# Patient Record
Sex: Male | Born: 1960
Health system: Southern US, Community
[De-identification: ages and names within clinical notes are randomized; demographics above are authoritative.]

## PROBLEM LIST (undated history)

## (undated) DIAGNOSIS — E119 Type 2 diabetes mellitus without complications: Secondary | ICD-10-CM

---

## 2004-04-03 ENCOUNTER — Encounter: Admission: RE | Admit: 2004-04-03 | Discharge: 2004-04-04 | Payer: Self-pay | Admitting: Internal Medicine

## 2011-01-23 ENCOUNTER — Ambulatory Visit
Admission: RE | Admit: 2011-01-23 | Discharge: 2011-01-23 | Disposition: A | Discharge status: Home / Self Care | Payer: BC Managed Care – PPO | Source: Ambulatory Visit | Attending: Internal Medicine | Admitting: Internal Medicine

## 2011-01-23 ENCOUNTER — Other Ambulatory Visit: Payer: Self-pay | Admitting: Internal Medicine

## 2011-01-23 DIAGNOSIS — M79604 Pain in right leg: Secondary | ICD-10-CM

## 2011-01-23 DIAGNOSIS — M7989 Other specified soft tissue disorders: Secondary | ICD-10-CM

## 2015-12-29 DIAGNOSIS — E113512 Type 2 diabetes mellitus with proliferative diabetic retinopathy with macular edema, left eye: Secondary | ICD-10-CM | POA: Diagnosis not present

## 2016-01-02 DIAGNOSIS — Z6827 Body mass index (BMI) 27.0-27.9, adult: Secondary | ICD-10-CM | POA: Diagnosis not present

## 2016-01-02 DIAGNOSIS — E1165 Type 2 diabetes mellitus with hyperglycemia: Secondary | ICD-10-CM | POA: Diagnosis not present

## 2016-01-11 DIAGNOSIS — H26492 Other secondary cataract, left eye: Secondary | ICD-10-CM | POA: Diagnosis not present

## 2016-01-31 DIAGNOSIS — E113593 Type 2 diabetes mellitus with proliferative diabetic retinopathy without macular edema, bilateral: Secondary | ICD-10-CM | POA: Diagnosis not present

## 2016-07-20 DIAGNOSIS — E113592 Type 2 diabetes mellitus with proliferative diabetic retinopathy without macular edema, left eye: Secondary | ICD-10-CM | POA: Diagnosis not present

## 2016-07-20 DIAGNOSIS — E113591 Type 2 diabetes mellitus with proliferative diabetic retinopathy without macular edema, right eye: Secondary | ICD-10-CM | POA: Diagnosis not present

## 2016-07-23 DIAGNOSIS — E113591 Type 2 diabetes mellitus with proliferative diabetic retinopathy without macular edema, right eye: Secondary | ICD-10-CM | POA: Diagnosis not present

## 2016-08-13 DIAGNOSIS — E113591 Type 2 diabetes mellitus with proliferative diabetic retinopathy without macular edema, right eye: Secondary | ICD-10-CM | POA: Diagnosis not present

## 2016-08-14 DIAGNOSIS — N529 Male erectile dysfunction, unspecified: Secondary | ICD-10-CM | POA: Diagnosis not present

## 2016-08-14 DIAGNOSIS — R5383 Other fatigue: Secondary | ICD-10-CM | POA: Diagnosis not present

## 2016-08-14 DIAGNOSIS — Z23 Encounter for immunization: Secondary | ICD-10-CM | POA: Diagnosis not present

## 2016-08-14 DIAGNOSIS — Z1211 Encounter for screening for malignant neoplasm of colon: Secondary | ICD-10-CM | POA: Diagnosis not present

## 2016-08-14 DIAGNOSIS — E1165 Type 2 diabetes mellitus with hyperglycemia: Secondary | ICD-10-CM | POA: Diagnosis not present

## 2016-08-20 DIAGNOSIS — E113591 Type 2 diabetes mellitus with proliferative diabetic retinopathy without macular edema, right eye: Secondary | ICD-10-CM | POA: Diagnosis not present

## 2016-09-17 DIAGNOSIS — E103512 Type 1 diabetes mellitus with proliferative diabetic retinopathy with macular edema, left eye: Secondary | ICD-10-CM | POA: Diagnosis not present

## 2016-09-17 DIAGNOSIS — E103511 Type 1 diabetes mellitus with proliferative diabetic retinopathy with macular edema, right eye: Secondary | ICD-10-CM | POA: Diagnosis not present

## 2016-10-15 DIAGNOSIS — E113593 Type 2 diabetes mellitus with proliferative diabetic retinopathy without macular edema, bilateral: Secondary | ICD-10-CM | POA: Diagnosis not present

## 2016-10-26 DIAGNOSIS — L03113 Cellulitis of right upper limb: Secondary | ICD-10-CM | POA: Diagnosis not present

## 2016-10-26 DIAGNOSIS — S61459A Open bite of unspecified hand, initial encounter: Secondary | ICD-10-CM | POA: Diagnosis not present

## 2016-10-26 DIAGNOSIS — Z6830 Body mass index (BMI) 30.0-30.9, adult: Secondary | ICD-10-CM | POA: Diagnosis not present

## 2016-11-12 DIAGNOSIS — E113593 Type 2 diabetes mellitus with proliferative diabetic retinopathy without macular edema, bilateral: Secondary | ICD-10-CM | POA: Diagnosis not present

## 2016-11-15 DIAGNOSIS — Z1389 Encounter for screening for other disorder: Secondary | ICD-10-CM | POA: Diagnosis not present

## 2016-11-15 DIAGNOSIS — E1165 Type 2 diabetes mellitus with hyperglycemia: Secondary | ICD-10-CM | POA: Diagnosis not present

## 2016-12-10 DIAGNOSIS — E113593 Type 2 diabetes mellitus with proliferative diabetic retinopathy without macular edema, bilateral: Secondary | ICD-10-CM | POA: Diagnosis not present

## 2017-01-09 DIAGNOSIS — E663 Overweight: Secondary | ICD-10-CM | POA: Diagnosis not present

## 2017-01-09 DIAGNOSIS — Z6829 Body mass index (BMI) 29.0-29.9, adult: Secondary | ICD-10-CM | POA: Diagnosis not present

## 2017-01-09 DIAGNOSIS — J3089 Other allergic rhinitis: Secondary | ICD-10-CM | POA: Diagnosis not present

## 2017-01-21 DIAGNOSIS — E113591 Type 2 diabetes mellitus with proliferative diabetic retinopathy without macular edema, right eye: Secondary | ICD-10-CM | POA: Diagnosis not present

## 2017-02-19 DIAGNOSIS — E113511 Type 2 diabetes mellitus with proliferative diabetic retinopathy with macular edema, right eye: Secondary | ICD-10-CM | POA: Diagnosis not present

## 2017-03-18 DIAGNOSIS — E113591 Type 2 diabetes mellitus with proliferative diabetic retinopathy without macular edema, right eye: Secondary | ICD-10-CM | POA: Diagnosis not present

## 2017-04-15 DIAGNOSIS — E113512 Type 2 diabetes mellitus with proliferative diabetic retinopathy with macular edema, left eye: Secondary | ICD-10-CM | POA: Diagnosis not present

## 2017-04-15 DIAGNOSIS — E113511 Type 2 diabetes mellitus with proliferative diabetic retinopathy with macular edema, right eye: Secondary | ICD-10-CM | POA: Diagnosis not present

## 2017-06-10 DIAGNOSIS — E113511 Type 2 diabetes mellitus with proliferative diabetic retinopathy with macular edema, right eye: Secondary | ICD-10-CM | POA: Diagnosis not present

## 2017-06-13 DIAGNOSIS — E1165 Type 2 diabetes mellitus with hyperglycemia: Secondary | ICD-10-CM | POA: Diagnosis not present

## 2017-06-13 DIAGNOSIS — E7800 Pure hypercholesterolemia, unspecified: Secondary | ICD-10-CM | POA: Diagnosis not present

## 2017-06-13 DIAGNOSIS — Z6829 Body mass index (BMI) 29.0-29.9, adult: Secondary | ICD-10-CM | POA: Diagnosis not present

## 2017-06-13 DIAGNOSIS — Z23 Encounter for immunization: Secondary | ICD-10-CM | POA: Diagnosis not present

## 2017-08-26 DIAGNOSIS — E113591 Type 2 diabetes mellitus with proliferative diabetic retinopathy without macular edema, right eye: Secondary | ICD-10-CM | POA: Diagnosis not present

## 2017-09-13 DIAGNOSIS — Z1211 Encounter for screening for malignant neoplasm of colon: Secondary | ICD-10-CM | POA: Diagnosis not present

## 2017-09-13 DIAGNOSIS — E1165 Type 2 diabetes mellitus with hyperglycemia: Secondary | ICD-10-CM | POA: Diagnosis not present

## 2017-09-13 DIAGNOSIS — E7800 Pure hypercholesterolemia, unspecified: Secondary | ICD-10-CM | POA: Diagnosis not present

## 2017-09-24 DIAGNOSIS — Z1212 Encounter for screening for malignant neoplasm of rectum: Secondary | ICD-10-CM | POA: Diagnosis not present

## 2017-09-24 DIAGNOSIS — Z1211 Encounter for screening for malignant neoplasm of colon: Secondary | ICD-10-CM | POA: Diagnosis not present

## 2017-10-09 DIAGNOSIS — J209 Acute bronchitis, unspecified: Secondary | ICD-10-CM | POA: Diagnosis not present

## 2017-10-09 DIAGNOSIS — J0100 Acute maxillary sinusitis, unspecified: Secondary | ICD-10-CM | POA: Diagnosis not present

## 2017-11-04 DIAGNOSIS — E113591 Type 2 diabetes mellitus with proliferative diabetic retinopathy without macular edema, right eye: Secondary | ICD-10-CM | POA: Diagnosis not present

## 2017-12-24 DIAGNOSIS — Z6831 Body mass index (BMI) 31.0-31.9, adult: Secondary | ICD-10-CM | POA: Diagnosis not present

## 2017-12-24 DIAGNOSIS — Z1331 Encounter for screening for depression: Secondary | ICD-10-CM | POA: Diagnosis not present

## 2017-12-24 DIAGNOSIS — G4700 Insomnia, unspecified: Secondary | ICD-10-CM | POA: Diagnosis not present

## 2017-12-24 DIAGNOSIS — E1165 Type 2 diabetes mellitus with hyperglycemia: Secondary | ICD-10-CM | POA: Diagnosis not present

## 2018-02-11 DIAGNOSIS — E113511 Type 2 diabetes mellitus with proliferative diabetic retinopathy with macular edema, right eye: Secondary | ICD-10-CM | POA: Diagnosis not present

## 2018-04-04 DIAGNOSIS — E7800 Pure hypercholesterolemia, unspecified: Secondary | ICD-10-CM | POA: Diagnosis not present

## 2018-04-04 DIAGNOSIS — E1165 Type 2 diabetes mellitus with hyperglycemia: Secondary | ICD-10-CM | POA: Diagnosis not present

## 2018-04-04 DIAGNOSIS — Z125 Encounter for screening for malignant neoplasm of prostate: Secondary | ICD-10-CM | POA: Diagnosis not present

## 2018-04-04 DIAGNOSIS — Z6831 Body mass index (BMI) 31.0-31.9, adult: Secondary | ICD-10-CM | POA: Diagnosis not present

## 2018-05-06 DIAGNOSIS — E113593 Type 2 diabetes mellitus with proliferative diabetic retinopathy without macular edema, bilateral: Secondary | ICD-10-CM | POA: Diagnosis not present

## 2018-06-27 DIAGNOSIS — H524 Presbyopia: Secondary | ICD-10-CM | POA: Diagnosis not present

## 2018-06-27 DIAGNOSIS — E113593 Type 2 diabetes mellitus with proliferative diabetic retinopathy without macular edema, bilateral: Secondary | ICD-10-CM | POA: Diagnosis not present

## 2018-07-08 DIAGNOSIS — Z23 Encounter for immunization: Secondary | ICD-10-CM | POA: Diagnosis not present

## 2018-07-08 DIAGNOSIS — E1165 Type 2 diabetes mellitus with hyperglycemia: Secondary | ICD-10-CM | POA: Diagnosis not present

## 2018-07-08 DIAGNOSIS — Z6831 Body mass index (BMI) 31.0-31.9, adult: Secondary | ICD-10-CM | POA: Diagnosis not present

## 2018-08-25 DIAGNOSIS — E113512 Type 2 diabetes mellitus with proliferative diabetic retinopathy with macular edema, left eye: Secondary | ICD-10-CM | POA: Diagnosis not present

## 2018-08-25 DIAGNOSIS — E113511 Type 2 diabetes mellitus with proliferative diabetic retinopathy with macular edema, right eye: Secondary | ICD-10-CM | POA: Diagnosis not present

## 2018-09-29 DIAGNOSIS — E113511 Type 2 diabetes mellitus with proliferative diabetic retinopathy with macular edema, right eye: Secondary | ICD-10-CM | POA: Diagnosis not present

## 2018-10-08 DIAGNOSIS — E1165 Type 2 diabetes mellitus with hyperglycemia: Secondary | ICD-10-CM | POA: Diagnosis not present

## 2018-10-08 DIAGNOSIS — Z6833 Body mass index (BMI) 33.0-33.9, adult: Secondary | ICD-10-CM | POA: Diagnosis not present

## 2018-10-08 DIAGNOSIS — N529 Male erectile dysfunction, unspecified: Secondary | ICD-10-CM | POA: Diagnosis not present

## 2018-10-08 DIAGNOSIS — E7800 Pure hypercholesterolemia, unspecified: Secondary | ICD-10-CM | POA: Diagnosis not present

## 2018-10-27 DIAGNOSIS — E113592 Type 2 diabetes mellitus with proliferative diabetic retinopathy without macular edema, left eye: Secondary | ICD-10-CM | POA: Diagnosis not present

## 2018-11-26 DIAGNOSIS — Z6833 Body mass index (BMI) 33.0-33.9, adult: Secondary | ICD-10-CM | POA: Diagnosis not present

## 2018-11-26 DIAGNOSIS — J101 Influenza due to other identified influenza virus with other respiratory manifestations: Secondary | ICD-10-CM | POA: Diagnosis not present

## 2018-12-04 DIAGNOSIS — E113593 Type 2 diabetes mellitus with proliferative diabetic retinopathy without macular edema, bilateral: Secondary | ICD-10-CM | POA: Diagnosis not present

## 2019-01-07 DIAGNOSIS — Z1331 Encounter for screening for depression: Secondary | ICD-10-CM | POA: Diagnosis not present

## 2019-01-07 DIAGNOSIS — E1165 Type 2 diabetes mellitus with hyperglycemia: Secondary | ICD-10-CM | POA: Diagnosis not present

## 2019-01-07 DIAGNOSIS — N529 Male erectile dysfunction, unspecified: Secondary | ICD-10-CM | POA: Diagnosis not present

## 2019-01-13 DIAGNOSIS — E113591 Type 2 diabetes mellitus with proliferative diabetic retinopathy without macular edema, right eye: Secondary | ICD-10-CM | POA: Diagnosis not present

## 2019-03-02 DIAGNOSIS — E113591 Type 2 diabetes mellitus with proliferative diabetic retinopathy without macular edema, right eye: Secondary | ICD-10-CM | POA: Diagnosis not present

## 2019-04-14 DIAGNOSIS — Z125 Encounter for screening for malignant neoplasm of prostate: Secondary | ICD-10-CM | POA: Diagnosis not present

## 2019-04-14 DIAGNOSIS — J309 Allergic rhinitis, unspecified: Secondary | ICD-10-CM | POA: Diagnosis not present

## 2019-04-14 DIAGNOSIS — N529 Male erectile dysfunction, unspecified: Secondary | ICD-10-CM | POA: Diagnosis not present

## 2019-04-14 DIAGNOSIS — E7800 Pure hypercholesterolemia, unspecified: Secondary | ICD-10-CM | POA: Diagnosis not present

## 2019-04-14 DIAGNOSIS — E1165 Type 2 diabetes mellitus with hyperglycemia: Secondary | ICD-10-CM | POA: Diagnosis not present

## 2019-04-27 DIAGNOSIS — E113591 Type 2 diabetes mellitus with proliferative diabetic retinopathy without macular edema, right eye: Secondary | ICD-10-CM | POA: Diagnosis not present

## 2019-06-22 DIAGNOSIS — E113591 Type 2 diabetes mellitus with proliferative diabetic retinopathy without macular edema, right eye: Secondary | ICD-10-CM | POA: Diagnosis not present

## 2019-07-15 DIAGNOSIS — J309 Allergic rhinitis, unspecified: Secondary | ICD-10-CM | POA: Diagnosis not present

## 2019-07-15 DIAGNOSIS — E7800 Pure hypercholesterolemia, unspecified: Secondary | ICD-10-CM | POA: Diagnosis not present

## 2019-07-15 DIAGNOSIS — Z23 Encounter for immunization: Secondary | ICD-10-CM | POA: Diagnosis not present

## 2019-07-15 DIAGNOSIS — E1165 Type 2 diabetes mellitus with hyperglycemia: Secondary | ICD-10-CM | POA: Diagnosis not present

## 2019-07-15 DIAGNOSIS — Z6832 Body mass index (BMI) 32.0-32.9, adult: Secondary | ICD-10-CM | POA: Diagnosis not present

## 2019-08-17 DIAGNOSIS — E113513 Type 2 diabetes mellitus with proliferative diabetic retinopathy with macular edema, bilateral: Secondary | ICD-10-CM | POA: Diagnosis not present

## 2019-10-19 DIAGNOSIS — E113511 Type 2 diabetes mellitus with proliferative diabetic retinopathy with macular edema, right eye: Secondary | ICD-10-CM | POA: Diagnosis not present

## 2019-10-21 DIAGNOSIS — G4700 Insomnia, unspecified: Secondary | ICD-10-CM | POA: Diagnosis not present

## 2019-10-21 DIAGNOSIS — E7800 Pure hypercholesterolemia, unspecified: Secondary | ICD-10-CM | POA: Diagnosis not present

## 2019-10-21 DIAGNOSIS — Z6835 Body mass index (BMI) 35.0-35.9, adult: Secondary | ICD-10-CM | POA: Diagnosis not present

## 2019-10-21 DIAGNOSIS — E1165 Type 2 diabetes mellitus with hyperglycemia: Secondary | ICD-10-CM | POA: Diagnosis not present

## 2019-12-21 DIAGNOSIS — E113511 Type 2 diabetes mellitus with proliferative diabetic retinopathy with macular edema, right eye: Secondary | ICD-10-CM | POA: Diagnosis not present

## 2020-01-01 DIAGNOSIS — Z20822 Contact with and (suspected) exposure to covid-19: Secondary | ICD-10-CM | POA: Diagnosis not present

## 2020-01-12 ENCOUNTER — Inpatient Hospital Stay (HOSPITAL_COMMUNITY)
Admission: EM | Admit: 2020-01-12 | Discharge: 2020-01-15 | DRG: 177 | Disposition: A | Discharge status: Home Health | Payer: BC Managed Care – PPO | Attending: Internal Medicine | Admitting: Internal Medicine

## 2020-01-12 ENCOUNTER — Emergency Department (HOSPITAL_COMMUNITY): Payer: BC Managed Care – PPO

## 2020-01-12 ENCOUNTER — Encounter (HOSPITAL_COMMUNITY): Payer: Self-pay | Admitting: Emergency Medicine

## 2020-01-12 ENCOUNTER — Other Ambulatory Visit: Payer: Self-pay

## 2020-01-12 DIAGNOSIS — J9601 Acute respiratory failure with hypoxia: Secondary | ICD-10-CM | POA: Diagnosis present

## 2020-01-12 DIAGNOSIS — Z7982 Long term (current) use of aspirin: Secondary | ICD-10-CM | POA: Diagnosis not present

## 2020-01-12 DIAGNOSIS — J9600 Acute respiratory failure, unspecified whether with hypoxia or hypercapnia: Secondary | ICD-10-CM | POA: Diagnosis not present

## 2020-01-12 DIAGNOSIS — I2699 Other pulmonary embolism without acute cor pulmonale: Secondary | ICD-10-CM | POA: Diagnosis not present

## 2020-01-12 DIAGNOSIS — E1165 Type 2 diabetes mellitus with hyperglycemia: Secondary | ICD-10-CM | POA: Diagnosis present

## 2020-01-12 DIAGNOSIS — E669 Obesity, unspecified: Secondary | ICD-10-CM | POA: Diagnosis not present

## 2020-01-12 DIAGNOSIS — E785 Hyperlipidemia, unspecified: Secondary | ICD-10-CM | POA: Diagnosis present

## 2020-01-12 DIAGNOSIS — Z6833 Body mass index (BMI) 33.0-33.9, adult: Secondary | ICD-10-CM

## 2020-01-12 DIAGNOSIS — Z9981 Dependence on supplemental oxygen: Secondary | ICD-10-CM

## 2020-01-12 DIAGNOSIS — U071 COVID-19: Secondary | ICD-10-CM

## 2020-01-12 DIAGNOSIS — Z794 Long term (current) use of insulin: Secondary | ICD-10-CM | POA: Diagnosis not present

## 2020-01-12 DIAGNOSIS — J1282 Pneumonia due to coronavirus disease 2019: Secondary | ICD-10-CM | POA: Diagnosis present

## 2020-01-12 DIAGNOSIS — Z87891 Personal history of nicotine dependence: Secondary | ICD-10-CM | POA: Diagnosis not present

## 2020-01-12 DIAGNOSIS — I82441 Acute embolism and thrombosis of right tibial vein: Secondary | ICD-10-CM | POA: Diagnosis not present

## 2020-01-12 DIAGNOSIS — I82462 Acute embolism and thrombosis of left calf muscular vein: Secondary | ICD-10-CM | POA: Diagnosis present

## 2020-01-12 DIAGNOSIS — R000 Tachycardia, unspecified: Secondary | ICD-10-CM | POA: Diagnosis not present

## 2020-01-12 DIAGNOSIS — R7989 Other specified abnormal findings of blood chemistry: Secondary | ICD-10-CM | POA: Diagnosis not present

## 2020-01-12 DIAGNOSIS — R0902 Hypoxemia: Secondary | ICD-10-CM | POA: Diagnosis not present

## 2020-01-12 DIAGNOSIS — T380X5A Adverse effect of glucocorticoids and synthetic analogues, initial encounter: Secondary | ICD-10-CM | POA: Diagnosis not present

## 2020-01-12 DIAGNOSIS — Z79899 Other long term (current) drug therapy: Secondary | ICD-10-CM

## 2020-01-12 DIAGNOSIS — Z833 Family history of diabetes mellitus: Secondary | ICD-10-CM | POA: Diagnosis not present

## 2020-01-12 DIAGNOSIS — R0602 Shortness of breath: Secondary | ICD-10-CM | POA: Diagnosis not present

## 2020-01-12 DIAGNOSIS — N179 Acute kidney failure, unspecified: Secondary | ICD-10-CM | POA: Diagnosis not present

## 2020-01-12 HISTORY — DX: Type 2 diabetes mellitus without complications: E11.9

## 2020-01-12 LAB — BASIC METABOLIC PANEL
Anion gap: 15 (ref 5–15)
BUN: 23 mg/dL — ABNORMAL HIGH (ref 6–20)
CO2: 25 mmol/L (ref 22–32)
Calcium: 8.8 mg/dL — ABNORMAL LOW (ref 8.9–10.3)
Chloride: 97 mmol/L — ABNORMAL LOW (ref 98–111)
Creatinine, Ser: 1.31 mg/dL — ABNORMAL HIGH (ref 0.61–1.24)
GFR calc Af Amer: >60 mL/min (ref >60)
GFR calc non Af Amer: >60 mL/min (ref >60)
Glucose, Bld: 230 mg/dL — ABNORMAL HIGH (ref 70–99)
Potassium: 4.6 mmol/L (ref 3.5–5.1)
Sodium: 137 mmol/L (ref 135–145)

## 2020-01-12 LAB — CBC
HCT: 47.7 % (ref 39.0–52.0)
Hemoglobin: 15.2 g/dL (ref 13.0–17.0)
MCH: 29 pg (ref 26.0–34.0)
MCHC: 31.9 g/dL (ref 30.0–36.0)
MCV: 90.9 fL (ref 80.0–100.0)
Platelets: 427 10*3/uL — ABNORMAL HIGH (ref 150–400)
RBC: 5.25 MIL/uL (ref 4.22–5.81)
RDW: 13.7 % (ref 11.5–15.5)
WBC: 6.8 10*3/uL (ref 4.0–10.5)
nRBC: 0 % (ref 0.0–0.2)

## 2020-01-12 LAB — LACTATE DEHYDROGENASE: LDH: 441 U/L — ABNORMAL HIGH (ref 98–192)

## 2020-01-12 LAB — C-REACTIVE PROTEIN: CRP: 11.5 mg/dL — ABNORMAL HIGH (ref <1.0)

## 2020-01-12 LAB — D-DIMER, QUANTITATIVE: D-Dimer, Quant: >20 ug/mL-FEU — ABNORMAL HIGH (ref 0.00–0.50)

## 2020-01-12 LAB — FERRITIN: Ferritin: 341 ng/mL — ABNORMAL HIGH (ref 24–336)

## 2020-01-12 LAB — FIBRINOGEN: Fibrinogen: 684 mg/dL — ABNORMAL HIGH (ref 210–475)

## 2020-01-12 LAB — PROCALCITONIN: Procalcitonin: 0.29 ng/mL

## 2020-01-12 LAB — RESPIRATORY PANEL BY RT PCR (FLU A&B, COVID)
Influenza A by PCR: NEGATIVE
Influenza B by PCR: NEGATIVE
SARS Coronavirus 2 by RT PCR: POSITIVE — AB

## 2020-01-12 LAB — LACTIC ACID, PLASMA
Lactic Acid, Venous: 2.7 mmol/L (ref 0.5–1.9)
Lactic Acid, Venous: 2.1 mmol/L (ref 0.5–1.9)

## 2020-01-12 LAB — TRIGLYCERIDES: Triglycerides: 124 mg/dL (ref <150)

## 2020-01-12 LAB — TROPONIN I (HIGH SENSITIVITY)
Troponin I (High Sensitivity): 5 ng/L (ref <18)
Troponin I (High Sensitivity): 5 ng/L (ref <18)

## 2020-01-12 MED ORDER — SODIUM CHLORIDE 0.9 % IV BOLUS
500.0000 mL | Freq: Once | INTRAVENOUS | Status: AC
  Administered 2020-01-12: 500 mL via INTRAVENOUS

## 2020-01-12 MED ORDER — DEXAMETHASONE SODIUM PHOSPHATE 10 MG/ML IJ SOLN
6.0000 mg | Freq: Once | INTRAMUSCULAR | Status: AC
  Administered 2020-01-13: 6 mg via INTRAVENOUS
  Filled 2020-01-12: qty 1

## 2020-01-12 MED ORDER — SODIUM CHLORIDE 0.9 % IV SOLN
100.0000 mg | Freq: Every day | INTRAVENOUS | Status: DC
  Administered 2020-01-14 – 2020-01-15 (×2): 100 mg via INTRAVENOUS
  Filled 2020-01-12 (×4): qty 20

## 2020-01-12 MED ORDER — SODIUM CHLORIDE 0.9% FLUSH: 3.0000 mL | Freq: Once | INTRAVENOUS | Status: DC

## 2020-01-12 MED ORDER — SODIUM CHLORIDE 0.9 % IV SOLN
200.0000 mg | Freq: Once | INTRAVENOUS | Status: AC
  Administered 2020-01-13: 200 mg via INTRAVENOUS
  Filled 2020-01-12: qty 40

## 2020-01-12 NOTE — ED Triage Notes (Signed)
Pt arrives to ED with C/o of shortness of breath and fatigue after testing + for covid April 25th. Pt had room air sats 88% placed on 2L [place].

## 2020-01-12 NOTE — ED Provider Notes (Addendum)
[name] EMERGENCY DEPARTMENT Provider Note   CSN: [id number] Arrival date & time: 01/12/20  1742     History Chief Complaint  Patient presents with  . Shortness of Breath    Cordarrel Stiefel is a 59 y.o. male.  59 year old male with prior medical history as detailed below presents for evaluation of shortness of breath.  Patient reports positive Covid test on April 25.  Patient reports increasing shortness of breath and cough over the last 1 week.  Patient denies chest pain.  Patient complains of increasing fatigue.  The history is provided by the patient and medical records.  Shortness of Breath Severity:  Moderate Onset quality:  Gradual Duration:  1 week Timing:  Constant Progression:  Worsening Chronicity:  New Relieved by:  Nothing Worsened by:  Nothing      Past Medical History:  Diagnosis Date  . Diabetes mellitus without complication (HCC)     There are no problems to display for this patient.       No family history on file.  Social History   Tobacco Use  . Smoking status: Not on file  Substance Use Topics  . Alcohol use: Not on file  . Drug use: Not on file    Home Medications Prior to Admission medications   Medication Sig Start Date End Date Taking? Authorizing Provider  acetaminophen (TYLENOL) 325 MG tablet Take 650 mg by mouth every 6 (six) hours as needed for mild pain.   Yes [provider]  aspirin EC 81 MG tablet Take 81 mg by mouth at bedtime.   Yes [provider]  atorvastatin (LIPITOR) 20 MG tablet Take 20 mg by mouth at bedtime.  01/02/20  Yes [provider]  Continuous Blood Gluc Sensor (FREESTYLE LIBRE 14 DAY SENSOR) MISC Inject 1 patch into the skin every 14 (fourteen) days. 12/13/19  Yes [provider]  JANUVIA 100 MG tablet Take 100 mg by mouth daily.  10/01/19  Yes [provider]  LEVEMIR FLEXTOUCH 100 UNIT/ML FlexPen Inject 80 Units into the skin See admin  instructions. Inject 80 units into the skin at bedtime, increasing by 1 unit a day until fasting glucose is 90-110 12/11/19  Yes [provider]  levocetirizine (XYZAL) 5 MG tablet Take 5 mg by mouth at bedtime.  10/01/19  Yes [provider]  metFORMIN (GLUCOPHAGE-XR) 500 MG 24 hr tablet Take 2,000 mg by mouth in the morning. 12/29/19  Yes [provider]  NOVOLOG FLEXPEN 100 UNIT/ML FlexPen Inject 5-25 Units into the skin See admin instructions. Inject 24-25 units into the skin before breakfast, 5 units before lunch, and 25 units before supper (evening meal) 12/07/19  Yes [provider]  VITAMIN D PO Take 1 capsule by mouth at bedtime.   Yes [provider]    Allergies    Patient has no known allergies.  Review of Systems   Review of Systems  Respiratory: Positive for shortness of breath.   All other systems reviewed and are negative.   Physical Exam Updated Vital Signs BP (!) 148/89   Pulse (!) 110   Temp 98.4 F (36.9 C) (Oral)   Resp (!) 29   Ht 5\' 10"  (1.778 m)   Wt 108.9 kg   SpO2 94%   BMI 34.44 kg/m   Physical Exam Vitals and nursing note reviewed.  Constitutional:      General: He is not in acute distress.    Appearance: He is ill-appearing.  HENT:     Head: Normocephalic and atraumatic.  Eyes:     Conjunctiva/sclera: Conjunctivae normal.     Pupils: Pupils are equal, round, and reactive to light.  Cardiovascular:     Rate and Rhythm: Normal rate and regular rhythm.     Heart sounds: Normal heart sounds.  Pulmonary:     Effort: Pulmonary effort is normal. No respiratory distress.     Breath sounds: Normal breath sounds.  Abdominal:     General: There is no distension.     Palpations: Abdomen is soft.     Tenderness: There is no abdominal tenderness.  Musculoskeletal:        General: No deformity. Normal range of motion.     Cervical back: Normal range of motion and neck supple.  Skin:    General: Skin is warm and  dry.  Neurological:     Mental Status: He is alert and oriented to person, place, and time.     ED Results / Procedures / Treatments   Labs (all labs ordered are listed, but only abnormal results are displayed) Labs Reviewed  RESPIRATORY PANEL BY RT PCR (FLU A&B, COVID) - Abnormal; Notable for the following components:      Result Value   SARS Coronavirus 2 by RT PCR POSITIVE (*)    All other components within normal limits  BASIC METABOLIC PANEL - Abnormal; Notable for the following components:   Chloride 97 (*)    Glucose, Bld 230 (*)    BUN 23 (*)    Creatinine, Ser 1.31 (*)    Calcium 8.8 (*)    All other components within normal limits  CBC - Abnormal; Notable for the following components:   Platelets 427 (*)    All other components within normal limits  LACTIC ACID, PLASMA - Abnormal; Notable for the following components:   Lactic Acid, Venous 2.7 (*)    All other components within normal limits  D-DIMER, QUANTITATIVE (NOT AT [facility]) - Abnormal; Notable for the following components:   D-Dimer, Quant >20.00 (*)    All other components within normal limits  LACTATE DEHYDROGENASE - Abnormal; Notable for the following components:   LDH 441 (*)    All other components within normal limits  FERRITIN - Abnormal; Notable for the following components:   Ferritin 341 (*)    All other components within normal limits  FIBRINOGEN - Abnormal; Notable for the following components:   Fibrinogen 684 (*)    All other components within normal limits  C-REACTIVE PROTEIN - Abnormal; Notable for the following components:   CRP 11.5 (*)    All other components within normal limits  CULTURE, BLOOD (ROUTINE X 2)  CULTURE, BLOOD (ROUTINE X 2)  PROCALCITONIN  TRIGLYCERIDES  LACTIC ACID, PLASMA  TROPONIN I (HIGH SENSITIVITY)  TROPONIN I (HIGH SENSITIVITY)    EKG EKG Interpretation  Date/Time:  Tuesday Jan 12 2020 17:49:09 EDT Ventricular Rate:  117 PR Interval:  152 QRS  Duration: 82 QT Interval:  314 QTC Calculation: 438 R Axis:   50 Text Interpretation: Sinus tachycardia Cannot rule out Anterior infarct , age undetermined Abnormal ECG Confirmed by Kristine Royal [phone]) on 01/12/2020 8:11:35 PM   Radiology DG Chest Port 1 View  Result Date: 01/12/2020 CLINICAL DATA:  Short of breath, COVID-19 positive EXAM: PORTABLE CHEST 1 VIEW COMPARISON:  None. FINDINGS: Single frontal view of the chest demonstrates an unremarkable cardiac silhouette. There is patchy bilateral airspace disease greatest at the left lung base.  No effusion or pneumothorax. No acute bony abnormalities. IMPRESSION: 1. Bilateral airspace disease greatest at the left lung base, consistent with multifocal pneumonia. This is certainly compatible with history of COVID-19. Electronically Signed   By: Randa Ngo M.D.   On: 01/12/2020 20:59    Procedures Procedures (including critical care time)  Medications Ordered in ED Medications  sodium chloride flush (NS) 0.9 % injection 3 mL (has no administration in time range)  sodium chloride 0.9 % bolus 500 mL (500 mLs Intravenous New Bag/Given 01/12/20 2220)    ED Course  I have reviewed the triage vital signs and the nursing notes.  Pertinent labs & imaging results that were available during my care of the patient were reviewed by me and considered in my medical decision making (see chart for details).    MDM Rules/Calculators/A&P                      MDM  Screen complete  Dione Mccombie was evaluated in Emergency Department on 01/12/2020 for the symptoms described in the history of present illness. He was evaluated in the context of the global COVID-19 pandemic, which necessitated consideration that the patient might be at risk for infection with the SARS-CoV-2 virus that causes COVID-19. Institutional protocols and algorithms that pertain to the evaluation of patients at risk for COVID-19 are in a state of rapid change based on information  released by regulatory bodies including the CDC and federal and state organizations. These policies and algorithms were followed during the patient's care in the ED.   Patient is presenting for evaluation of worsening shortness of breath in the setting of recent Covid diagnosis.  His presentation is consistent with likely Covid pneumonia.  Patient will benefit from admission for further treatment.  Hospitalist [facility]) service is aware case and will evaluate for admission.   Final Clinical Impression(s) / ED Diagnoses Final diagnoses:  Pneumonia due to COVID-19 virus    Rx / DC Orders ED Discharge Orders    None       Valarie Merino, MD 01/12/20 2234    Valarie Merino, MD 01/12/20 [id number]

## 2020-01-13 ENCOUNTER — Other Ambulatory Visit: Payer: Self-pay

## 2020-01-13 ENCOUNTER — Inpatient Hospital Stay (HOSPITAL_COMMUNITY): Payer: BC Managed Care – PPO

## 2020-01-13 ENCOUNTER — Telehealth: Payer: Self-pay | Admitting: Unknown Physician Specialty

## 2020-01-13 ENCOUNTER — Encounter (HOSPITAL_COMMUNITY): Payer: Self-pay | Admitting: Internal Medicine

## 2020-01-13 DIAGNOSIS — Z794 Long term (current) use of insulin: Secondary | ICD-10-CM | POA: Diagnosis not present

## 2020-01-13 DIAGNOSIS — J1282 Pneumonia due to coronavirus disease 2019: Secondary | ICD-10-CM

## 2020-01-13 DIAGNOSIS — R7989 Other specified abnormal findings of blood chemistry: Secondary | ICD-10-CM | POA: Diagnosis not present

## 2020-01-13 DIAGNOSIS — U071 COVID-19: Secondary | ICD-10-CM

## 2020-01-13 DIAGNOSIS — J9601 Acute respiratory failure with hypoxia: Secondary | ICD-10-CM

## 2020-01-13 DIAGNOSIS — Z79899 Other long term (current) drug therapy: Secondary | ICD-10-CM | POA: Diagnosis not present

## 2020-01-13 DIAGNOSIS — I2699 Other pulmonary embolism without acute cor pulmonale: Secondary | ICD-10-CM | POA: Diagnosis present

## 2020-01-13 DIAGNOSIS — R000 Tachycardia, unspecified: Secondary | ICD-10-CM | POA: Diagnosis present

## 2020-01-13 DIAGNOSIS — Z9981 Dependence on supplemental oxygen: Secondary | ICD-10-CM | POA: Diagnosis not present

## 2020-01-13 DIAGNOSIS — T380X5A Adverse effect of glucocorticoids and synthetic analogues, initial encounter: Secondary | ICD-10-CM | POA: Diagnosis not present

## 2020-01-13 DIAGNOSIS — Z87891 Personal history of nicotine dependence: Secondary | ICD-10-CM | POA: Diagnosis not present

## 2020-01-13 DIAGNOSIS — R0602 Shortness of breath: Secondary | ICD-10-CM | POA: Diagnosis not present

## 2020-01-13 DIAGNOSIS — N179 Acute kidney failure, unspecified: Secondary | ICD-10-CM | POA: Diagnosis present

## 2020-01-13 DIAGNOSIS — Z833 Family history of diabetes mellitus: Secondary | ICD-10-CM | POA: Diagnosis not present

## 2020-01-13 DIAGNOSIS — E1165 Type 2 diabetes mellitus with hyperglycemia: Secondary | ICD-10-CM | POA: Diagnosis not present

## 2020-01-13 DIAGNOSIS — E669 Obesity, unspecified: Secondary | ICD-10-CM | POA: Diagnosis present

## 2020-01-13 DIAGNOSIS — E785 Hyperlipidemia, unspecified: Secondary | ICD-10-CM | POA: Diagnosis present

## 2020-01-13 DIAGNOSIS — I82441 Acute embolism and thrombosis of right tibial vein: Secondary | ICD-10-CM | POA: Diagnosis present

## 2020-01-13 DIAGNOSIS — J9600 Acute respiratory failure, unspecified whether with hypoxia or hypercapnia: Secondary | ICD-10-CM | POA: Diagnosis present

## 2020-01-13 DIAGNOSIS — Z7982 Long term (current) use of aspirin: Secondary | ICD-10-CM | POA: Diagnosis not present

## 2020-01-13 DIAGNOSIS — I82462 Acute embolism and thrombosis of left calf muscular vein: Secondary | ICD-10-CM | POA: Diagnosis present

## 2020-01-13 DIAGNOSIS — Z6833 Body mass index (BMI) 33.0-33.9, adult: Secondary | ICD-10-CM | POA: Diagnosis not present

## 2020-01-13 LAB — CBC
HCT: 41.2 % (ref 39.0–52.0)
Hemoglobin: 13.2 g/dL (ref 13.0–17.0)
MCH: 29.2 pg (ref 26.0–34.0)
MCHC: 32 g/dL (ref 30.0–36.0)
MCV: 91.2 fL (ref 80.0–100.0)
Platelets: 362 10*3/uL (ref 150–400)
RBC: 4.52 MIL/uL (ref 4.22–5.81)
RDW: 13.7 % (ref 11.5–15.5)
WBC: 7.4 10*3/uL (ref 4.0–10.5)
nRBC: 0 % (ref 0.0–0.2)

## 2020-01-13 LAB — CBC WITH DIFFERENTIAL/PLATELET
Abs Immature Granulocytes: 0.09 10*3/uL — ABNORMAL HIGH (ref 0.00–0.07)
Basophils Absolute: 0 10*3/uL (ref 0.0–0.1)
Basophils Relative: 0 %
Eosinophils Absolute: 0 10*3/uL (ref 0.0–0.5)
Eosinophils Relative: 0 %
HCT: 41.4 % (ref 39.0–52.0)
Hemoglobin: 13.5 g/dL (ref 13.0–17.0)
Immature Granulocytes: 1 %
Lymphocytes Relative: 9 %
Lymphs Abs: 0.6 10*3/uL — ABNORMAL LOW (ref 0.7–4.0)
MCH: 29.6 pg (ref 26.0–34.0)
MCHC: 32.6 g/dL (ref 30.0–36.0)
MCV: 90.8 fL (ref 80.0–100.0)
Monocytes Absolute: 0.4 10*3/uL (ref 0.1–1.0)
Monocytes Relative: 6 %
Neutro Abs: 5.3 10*3/uL (ref 1.7–7.7)
Neutrophils Relative %: 84 %
Platelets: 362 10*3/uL (ref 150–400)
RBC: 4.56 MIL/uL (ref 4.22–5.81)
RDW: 13.7 % (ref 11.5–15.5)
WBC: 6.4 10*3/uL (ref 4.0–10.5)
nRBC: 0 % (ref 0.0–0.2)

## 2020-01-13 LAB — COMPREHENSIVE METABOLIC PANEL
ALT: 23 U/L (ref 0–44)
AST: 34 U/L (ref 15–41)
Albumin: 2.2 g/dL — ABNORMAL LOW (ref 3.5–5.0)
Alkaline Phosphatase: 51 U/L (ref 38–126)
Anion gap: 13 (ref 5–15)
BUN: 28 mg/dL — ABNORMAL HIGH (ref 6–20)
CO2: 25 mmol/L (ref 22–32)
Calcium: 8.3 mg/dL — ABNORMAL LOW (ref 8.9–10.3)
Chloride: 95 mmol/L — ABNORMAL LOW (ref 98–111)
Creatinine, Ser: 1.41 mg/dL — ABNORMAL HIGH (ref 0.61–1.24)
GFR calc Af Amer: >60 mL/min (ref >60)
GFR calc non Af Amer: 55 mL/min — ABNORMAL LOW (ref >60)
Glucose, Bld: 331 mg/dL — ABNORMAL HIGH (ref 70–99)
Potassium: 5.5 mmol/L — ABNORMAL HIGH (ref 3.5–5.1)
Sodium: 133 mmol/L — ABNORMAL LOW (ref 135–145)
Total Bilirubin: 0.9 mg/dL (ref 0.3–1.2)
Total Protein: 6.5 g/dL (ref 6.5–8.1)

## 2020-01-13 LAB — GLUCOSE, CAPILLARY
Glucose-Capillary: 291 mg/dL — ABNORMAL HIGH (ref 70–99)
Glucose-Capillary: 314 mg/dL — ABNORMAL HIGH (ref 70–99)
Glucose-Capillary: 387 mg/dL — ABNORMAL HIGH (ref 70–99)
Glucose-Capillary: 378 mg/dL — ABNORMAL HIGH (ref 70–99)
Glucose-Capillary: 411 mg/dL — ABNORMAL HIGH (ref 70–99)

## 2020-01-13 LAB — FERRITIN: Ferritin: 327 ng/mL (ref 24–336)

## 2020-01-13 LAB — CREATININE, SERUM
Creatinine, Ser: 1.39 mg/dL — ABNORMAL HIGH (ref 0.61–1.24)
GFR calc Af Amer: >60 mL/min (ref >60)
GFR calc non Af Amer: 56 mL/min — ABNORMAL LOW (ref >60)

## 2020-01-13 LAB — C-REACTIVE PROTEIN: CRP: 9.2 mg/dL — ABNORMAL HIGH (ref <1.0)

## 2020-01-13 LAB — HIV ANTIBODY (ROUTINE TESTING W REFLEX): HIV Screen 4th Generation wRfx: NONREACTIVE

## 2020-01-13 LAB — D-DIMER, QUANTITATIVE: D-Dimer, Quant: >20 ug/mL-FEU — ABNORMAL HIGH (ref 0.00–0.50)

## 2020-01-13 MED ORDER — ENOXAPARIN SODIUM 120 MG/0.8ML ~~LOC~~ SOLN
110.0000 mg | Freq: Two times a day (BID) | SUBCUTANEOUS | Status: DC
  Administered 2020-01-13 – 2020-01-14 (×3): 110 mg via SUBCUTANEOUS
  Filled 2020-01-13 (×4): qty 0.73

## 2020-01-13 MED ORDER — INSULIN ASPART 100 UNIT/ML ~~LOC~~ SOLN: 0.0000 [IU] | Freq: Every day | SUBCUTANEOUS | Status: DC

## 2020-01-13 MED ORDER — INSULIN ASPART 100 UNIT/ML ~~LOC~~ SOLN
6.0000 [IU] | Freq: Three times a day (TID) | SUBCUTANEOUS | Status: DC
  Administered 2020-01-13 – 2020-01-14 (×2): 6 [IU] via SUBCUTANEOUS

## 2020-01-13 MED ORDER — HYDROCOD POLST-CPM POLST ER 10-8 MG/5ML PO SUER
5.0000 mL | Freq: Two times a day (BID) | ORAL | Status: DC | PRN
  Administered 2020-01-14: 5 mL via ORAL
  Filled 2020-01-13: qty 5

## 2020-01-13 MED ORDER — IOHEXOL 350 MG/ML SOLN
100.0000 mL | Freq: Once | INTRAVENOUS | Status: AC | PRN
  Administered 2020-01-13: 50 mL via INTRAVENOUS

## 2020-01-13 MED ORDER — ATORVASTATIN CALCIUM 10 MG PO TABS
20.0000 mg | ORAL_TABLET | Freq: Every day | ORAL | Status: DC
  Administered 2020-01-13 – 2020-01-14 (×2): 20 mg via ORAL
  Filled 2020-01-13 (×2): qty 2

## 2020-01-13 MED ORDER — INSULIN DETEMIR 100 UNIT/ML ~~LOC~~ SOLN
80.0000 [IU] | Freq: Every day | SUBCUTANEOUS | Status: DC
  Administered 2020-01-13 – 2020-01-14 (×3): 80 [IU] via SUBCUTANEOUS
  Filled 2020-01-13 (×4): qty 0.8

## 2020-01-13 MED ORDER — ENOXAPARIN SODIUM 40 MG/0.4ML ~~LOC~~ SOLN
40.0000 mg | SUBCUTANEOUS | Status: DC
  Administered 2020-01-13: 40 mg via SUBCUTANEOUS
  Filled 2020-01-13: qty 0.4

## 2020-01-13 MED ORDER — ACETAMINOPHEN 325 MG PO TABS: 650.0000 mg | ORAL_TABLET | Freq: Four times a day (QID) | ORAL | Status: DC | PRN

## 2020-01-13 MED ORDER — INSULIN ASPART 100 UNIT/ML ~~LOC~~ SOLN
0.0000 [IU] | Freq: Three times a day (TID) | SUBCUTANEOUS | Status: DC
  Administered 2020-01-13: 20 [IU] via SUBCUTANEOUS
  Administered 2020-01-14: 15 [IU] via SUBCUTANEOUS
  Administered 2020-01-14: 20 [IU] via SUBCUTANEOUS
  Administered 2020-01-14: 7 [IU] via SUBCUTANEOUS
  Administered 2020-01-15: 4 [IU] via SUBCUTANEOUS

## 2020-01-13 MED ORDER — INSULIN ASPART 100 UNIT/ML ~~LOC~~ SOLN
7.0000 [IU] | Freq: Once | SUBCUTANEOUS | Status: AC
  Administered 2020-01-13: 7 [IU] via SUBCUTANEOUS

## 2020-01-13 MED ORDER — ALBUTEROL SULFATE HFA 108 (90 BASE) MCG/ACT IN AERS
2.0000 | INHALATION_SPRAY | RESPIRATORY_TRACT | Status: DC | PRN
  Filled 2020-01-13: qty 6.7

## 2020-01-13 MED ORDER — BENZONATATE 100 MG PO CAPS
200.0000 mg | ORAL_CAPSULE | Freq: Three times a day (TID) | ORAL | Status: DC
  Administered 2020-01-13 – 2020-01-15 (×7): 200 mg via ORAL
  Filled 2020-01-13 (×7): qty 2

## 2020-01-13 MED ORDER — INSULIN ASPART 100 UNIT/ML ~~LOC~~ SOLN
0.0000 [IU] | Freq: Three times a day (TID) | SUBCUTANEOUS | Status: DC
  Administered 2020-01-13: 7 [IU] via SUBCUTANEOUS
  Administered 2020-01-13: 9 [IU] via SUBCUTANEOUS

## 2020-01-13 MED ORDER — ASPIRIN EC 81 MG PO TBEC
81.0000 mg | DELAYED_RELEASE_TABLET | Freq: Every day | ORAL | Status: DC
  Administered 2020-01-13 – 2020-01-14 (×2): 81 mg via ORAL
  Filled 2020-01-13 (×2): qty 1

## 2020-01-13 MED ORDER — DEXAMETHASONE SODIUM PHOSPHATE 10 MG/ML IJ SOLN
6.0000 mg | INTRAMUSCULAR | Status: DC
  Administered 2020-01-13 – 2020-01-15 (×3): 6 mg via INTRAVENOUS
  Filled 2020-01-13 (×3): qty 1

## 2020-01-13 MED ORDER — ONDANSETRON HCL 4 MG PO TABS: 4.0000 mg | ORAL_TABLET | Freq: Four times a day (QID) | ORAL | Status: DC | PRN

## 2020-01-13 MED ORDER — ONDANSETRON HCL 4 MG/2ML IJ SOLN: 4.0000 mg | Freq: Four times a day (QID) | INTRAMUSCULAR | Status: DC | PRN

## 2020-01-13 NOTE — H&P (Signed)
History and Physical    Mark Boyer BSW:[id number] DOB: 1960-09-12 DOA: 01/12/2020  PCP: [facility]  Patient coming from: Home.  Chief Complaint: Shortness of breath.  HPI: Mark Boyer is a 59 y.o. male with history of diabetes mellitus type 2 hyperlipidemia was tested positive for Covid on April 23 at that time patient symptoms was fatigue weakness and cough.  Since then patient has become progressively short of breath and decided to come to the ER.  Denies any chest pain diarrhea.  ED Course: In the ER patient has required 2 L oxygen to maintain sats chest x-ray showing bilateral pneumonia.  Covid test is positive.  Labs show creatinine of 1.3 no old labs to compare blood glucose was 230 CRP was 11.5 lactic acid 2.7 procalcitonin 0.2 CBC unremarkable.  D-dimer is more than 20.  EKG shows sinus tachycardia.  Patient admitted for acute hypoxic respiratory failure secondary to Covid infection.  Was started on remdesivir and Decadron.  Review of Systems: As per HPI, rest all negative.   Past Medical History:  Diagnosis Date  . Diabetes mellitus without complication (HCC)     History reviewed. No pertinent surgical history.   reports that he has quit smoking. He has never used smokeless tobacco. No history on file for alcohol and drug.  No Known Allergies  Family History  Problem Relation Age of Onset  . Diabetes Mellitus II Father     Prior to Admission medications   Medication Sig Start Date End Date Taking? Authorizing Provider  acetaminophen (TYLENOL) 325 MG tablet Take 650 mg by mouth every 6 (six) hours as needed for mild pain.   Yes [provider]  aspirin EC 81 MG tablet Take 81 mg by mouth at bedtime.   Yes [provider]  atorvastatin (LIPITOR) 20 MG tablet Take 20 mg by mouth at bedtime.  01/02/20  Yes [provider]  Continuous Blood Gluc Sensor (FREESTYLE LIBRE 14 DAY SENSOR) MISC Inject 1 patch into the skin every 14  (fourteen) days. 12/13/19  Yes [provider]  JANUVIA 100 MG tablet Take 100 mg by mouth daily.  10/01/19  Yes [provider]  LEVEMIR FLEXTOUCH 100 UNIT/ML FlexPen Inject 80 Units into the skin See admin instructions. Inject 80 units into the skin at bedtime, increasing by 1 unit a day until fasting glucose is 90-110 12/11/19  Yes [provider]  levocetirizine (XYZAL) 5 MG tablet Take 5 mg by mouth at bedtime.  10/01/19  Yes [provider]  metFORMIN (GLUCOPHAGE-XR) 500 MG 24 hr tablet Take 2,000 mg by mouth in the morning. 12/29/19  Yes [provider]  NOVOLOG FLEXPEN 100 UNIT/ML FlexPen Inject 5-25 Units into the skin See admin instructions. Inject 24-25 units into the skin before breakfast, 5 units before lunch, and 25 units before supper (evening meal) 12/07/19  Yes [provider]  VITAMIN D PO Take 1 capsule by mouth at bedtime.   Yes [provider]    Physical Exam: Constitutional: Moderately built and nourished. Vitals:   01/12/20 2215 01/12/20 2339 01/13/20 0100 01/13/20 0151  BP: (!) 148/89 140/82 139/86 135/87  Pulse: (!) 110 (!) 103  100  Resp: (!) 29 20  (!) 28  Temp:    98.2 F (36.8 C)  TempSrc:    Oral  SpO2: 94% 95%  92%  Weight:      Height:       Eyes: Anicteric no pallor. ENMT: No discharge  from the ears eyes nose or mouth. Neck: No mass felt.  No neck rigidity. Respiratory: No rhonchi or crepitations. Cardiovascular: S1-S2 heard. Abdomen: Soft nontender bowel sounds present. Musculoskeletal: No edema. Skin: No rash. Neurologic: Alert awake oriented time place and person.  Moves all extremities. Psychiatric: Appears normal per normal affect.   Labs on Admission: I have personally reviewed following labs and imaging studies  CBC: Recent Labs  Lab 01/12/20 1800  WBC 6.8  HGB 15.2  HCT 47.7  MCV 90.9  PLT 427*   Basic Metabolic Panel: Recent Labs  Lab 01/12/20 1800  NA 137  K 4.6  CL  97*  CO2 25  GLUCOSE 230*  BUN 23*  CREATININE 1.31*  CALCIUM 8.8*   GFR: Estimated Creatinine Clearance: 76.9 mL/min (A) (by C-G formula based on SCr of 1.31 mg/dL (H)). Liver Function Tests: No results for input(s): AST, ALT, ALKPHOS, BILITOT, PROT, ALBUMIN in the last 168 hours. No results for input(s): LIPASE, AMYLASE in the last 168 hours. No results for input(s): AMMONIA in the last 168 hours. Coagulation Profile: No results for input(s): INR, PROTIME in the last 168 hours. Cardiac Enzymes: No results for input(s): CKTOTAL, CKMB, CKMBINDEX, TROPONINI in the last 168 hours. BNP (last 3 results) No results for input(s): PROBNP in the last 8760 hours. HbA1C: No results for input(s): HGBA1C in the last 72 hours. CBG: No results for input(s): GLUCAP in the last 168 hours. Lipid Profile: Recent Labs    01/12/20 2010  TRIG 124   Thyroid Function Tests: No results for input(s): TSH, T4TOTAL, FREET4, T3FREE, THYROIDAB in the last 72 hours. Anemia Panel: Recent Labs    01/12/20 2010  FERRITIN 341*   Urine analysis: No results found for: COLORURINE, APPEARANCEUR, LABSPEC, PHURINE, GLUCOSEU, HGBUR, BILIRUBINUR, KETONESUR, PROTEINUR, UROBILINOGEN, NITRITE, LEUKOCYTESUR Sepsis Labs: @LABRCNTIP (procalcitonin:4,lacticidven:4) ) Recent Results (from the past 240 hour(s))  Respiratory Panel by RT PCR (Flu A&B, Covid) - Nasopharyngeal Swab     Status: Abnormal   Collection Time: 01/12/20  8:11 PM   Specimen: Nasopharyngeal Swab  Result Value Ref Range Status   SARS Coronavirus 2 by RT PCR POSITIVE (A) NEGATIVE Final    Comment: RESULT CALLED TO, READ BACK BY AND VERIFIED WITH: P PEICKER 01/12/20 2214 JDW (NOTE) SARS-CoV-2 target nucleic acids are DETECTED. SARS-CoV-2 RNA is generally detectable in upper respiratory specimens  during the acute phase of infection. Positive results are indicative of the presence of the identified virus, but do not rule out bacterial infection or  co-infection with other pathogens not detected by the test. Clinical correlation with patient history and other diagnostic information is necessary to determine patient infection status. The expected result is Negative. Fact Sheet for Patients:  2215 Fact Sheet for Healthcare Providers: https://www.moore.com/ This test is not yet approved or cleared by the https://www.young.biz/ FDA and  has been authorized for detection and/or diagnosis of SARS-CoV-2 by FDA under an Emergency Use Authorization (EUA).  This EUA will remain in effect (meaning this test can be used) for the dur ation of  the COVID-19 declaration under Section 564(b)(1) of the Act, 21 U.S.C. section 360bbb-3(b)(1), unless the authorization is terminated or revoked sooner.    Influenza A by PCR NEGATIVE NEGATIVE Final   Influenza B by PCR NEGATIVE NEGATIVE Final    Comment: (NOTE) The Xpert Xpress SARS-CoV-2/FLU/RSV assay is intended as an aid in  the diagnosis of influenza from Nasopharyngeal swab specimens and  should not be used as a sole basis  for treatment. Nasal washings and  aspirates are unacceptable for Xpert Xpress SARS-CoV-2/FLU/RSV  testing. Fact Sheet for Patients: PinkCheek.be Fact Sheet for Healthcare Providers: GravelBags.it This test is not yet approved or cleared by the [place] FDA and  has been authorized for detection and/or diagnosis of SARS-CoV-2 by  FDA under an Emergency Use Authorization (EUA). This EUA will remain  in effect (meaning this test can be used) for the duration of the  Covid-19 declaration under Section 564(b)(1) of the Act, 21  U.S.C. section 360bbb-3(b)(1), unless the authorization is  terminated or revoked. Performed at [facility], [place] [address] [place], [place] [address]      Radiological Exams on Admission: DG Chest Port 1 View  Result Date:  01/12/2020 CLINICAL DATA:  Short of breath, COVID-19 positive EXAM: PORTABLE CHEST 1 VIEW COMPARISON:  None. FINDINGS: Single frontal view of the chest demonstrates an unremarkable cardiac silhouette. There is patchy bilateral airspace disease greatest at the left lung base. No effusion or pneumothorax. No acute bony abnormalities. IMPRESSION: 1. Bilateral airspace disease greatest at the left lung base, consistent with multifocal pneumonia. This is certainly compatible with history of COVID-19. Electronically Signed   By: Randa Ngo M.D.   On: 01/12/2020 20:59    EKG: Independently reviewed.  Sinus tachycardia.  Assessment/Plan Principal Problem:   Acute respiratory failure due to COVID-19 [facility]) Active Problems:   Acute respiratory failure with hypoxia (HCC)   Pneumonia due to COVID-19 virus   Uncontrolled type 2 diabetes mellitus with hyperglycemia ([place])    1. Acute respiratory failure hypoxia secondary to COVID-19 infection patient is requiring 2 L oxygen to maintain sats patient started on IV remdesivir Decadron.  Since patient is significantly elevated D-dimer will get a CT angiogram of the chest to rule out pulmonary embolism. 2. Diabetes mellitus type 2 uncontrolled presently on Levemir 80 units at bedtime which I have ordered along with sliding scale coverage.  Note that patient is on Decadron. 3. Hyperlipidemia on statins. 4. Acute renal failure no old labs to compare follow metabolic panel.  Given that patient has acute respiratory failure with Covid pneumonia will need close monitoring for any further deterioration in inpatient status.   DVT prophylaxis: Lovenox. Code Status: Full code. Family Communication: Discussed with patient. Disposition Plan: Home. Consults called: None. Admission status: Inpatient.   Rise Patience MD [facility] Pager [phone].  If 7PM-7AM, please contact night-coverage [email or web address] Password [email or web address]  01/13/2020, 2:06 AM

## 2020-01-13 NOTE — Telephone Encounter (Signed)
Erroneous encounter

## 2020-01-13 NOTE — Progress Notes (Signed)
ANTICOAGULATION CONSULT NOTE - Initial Consult  Pharmacy Consult for Lovenox Indication: pulmonary embolus and DVT  No Known Allergies  Patient Measurements: Height: 5\' 10"  (177.8 cm) Weight: 106.5 kg (234 lb 11.2 oz) IBW/kg (Calculated) : 73 Lovenox Dosing Weight: 106.5 kg  Vital Signs: Temp: 98 F (36.7 C) (05/05 1627) Temp Source: Oral (05/05 1627) BP: 133/82 (05/05 1627) Pulse Rate: 91 (05/05 1627)  Labs: Recent Labs    01/12/20 1800 01/12/20 1800 01/12/20 2038 01/13/20 0543 01/13/20 0644  HGB 15.2   < >  --  13.2 13.5  HCT 47.7  --   --  41.2 41.4  PLT 427*  --   --  362 362  CREATININE 1.31*  --   --  1.39* 1.41*  TROPONINIHS 5  --  5  --   --    < > = values in this interval not displayed.    Estimated Creatinine Clearance: 70.6 mL/min (A) (by C-G formula based on SCr of 1.41 mg/dL (H)).   Medical History: Past Medical History:  Diagnosis Date  . Diabetes mellitus without complication [facility])    Assessment:  59 yr old male admitted early am with shortness of breath, COVID-19 pneumonia. Remdesivir and steroids begun.  D-dimer > 20, CTA showed PE and duplex showed bilateral DVT.    Lovenox 40 mg SQ given ~5:30am today for VTE prophylaxis.  Goal of Therapy:  Anti-Xa level 0.6-1 units/ml 4hrs after LMWH dose given Monitor platelets by anticoagulation protocol: Yes   Plan:   Increase Lovenox to 110 mg (~1 mg/kg) SQ q12hrs,  First full-dose given at 2pm.  Plan next Lovenox dose at midnight, then 10a/10p beginning 5/6.  Follow up for oral anticoagulation plans.    60, RPh Phone: [phone] 01/13/2020,5:13 PM

## 2020-01-13 NOTE — Progress Notes (Addendum)
PROGRESS NOTE                                                                                                                                                                                                             Patient Demographics:    Mark Boyer, is a 59 y.o. male, DOB - November 26, 1960, DZH:[id number]  Outpatient Primary MD for the patient is Practice, [facility]   Admit date - 01/12/2020   LOS - 0  Chief Complaint  Patient presents with  . Shortness of Breath       Brief Narrative: Patient is a 59 y.o. male with PMHx of insulin-dependent DM-2, HLD-who was positive for COVID-19 on 4/23-presented with shortness of breath-found to have acute hypoxic respiratory failure and subsequently admitted to the hospitalist service.  See below for further details.  Significant Events: 5/4>> admit to [facility] for hypoxemia secondary to COVID-19 pneumonia  COVID-19 medications: Steroids: 5/4>> Remdesivir: 5/4>>  Antibiotics: None  Microbiology data: 5/4: Blood cultures>> negative so far  DVT prophylaxis: SQ Lovenox  Procedures: None  Consults: None    Subjective:    Mark Boyer today remains stable on just 2 L of oxygen-continues to have cough but does not have shortness of breath at rest.   Assessment  & Plan :   Acute Hypoxic Resp Failure due to Covid 19 Viral pneumonia: Remains stable-essentially the same compared to yesterday-CRP trending down.  Plans are to continue with steroids/remdesivir, continue to titrate down FiO2 as tolerated.  Prone/Incentive Spirometry: encouraged  incentive spirometry use 3-4/hour.  Fever: afebrile  O2 requirements:  SpO2: 95 % O2 Flow Rate (L/min): 2 L/min   COVID-19 Labs: Recent Labs    01/12/20 2010 01/13/20 0644  DDIMER >20.00* >20.00*  FERRITIN 341* 327  LDH 441*  --   CRP 11.5* 9.2*    No results found for: BNP  Recent Labs  Lab 01/12/20 2010    PROCALCITON 0.29    Lab Results  Component Value Date   SARSCOV2NAA POSITIVE (A) 01/12/2020    Significantly elevated D-dimer: Has mild hypoxemia-but given Covid-at risk for VTE-awaiting CT angiogram of the chest-ordered by admitting MD.  Have also ordered a lower extremity Doppler.  For now continue with prophylactic Lovenox given mild hypoxemia.  Addendum: CTA chest and lower extremity Dopplers positive for VTE-have started therapeutic Lovenox.  AKI vs CKD stage 3a: no prior labs to compare with-follow for now  HLD: Continue statin  Insulin-dependent DM-2 with uncontrolled hyperglycemia secondary to steroids: Continue Lantus 80 units-add NovoLog 6 units with meals-change SSI to resistant scale.  Observe 24 hours-if CBGs remain uncontrolled-we will make further adjustments.  Recent Labs    01/13/20 0259 01/13/20 0750  GLUCAP 291* 314*    Obesity: Estimated body mass index is 33.68 kg/m as calculated from the following:   Height as of this encounter: 5\' 10"  (1.778 m).   Weight as of this encounter: 106.5 kg.     ABG: No results found for: PHART, PCO2ART, PO2ART, HCO3, TCO2, ACIDBASEDEF, O2SAT  Vent Settings: N/A    Condition - Stable  Family Communication  :  Patient will update spouse himself-have asked him to let me know if his family wants to talk to a MD  Code Status :  Full Code  Diet :  Diet Order            Diet heart healthy/carb modified Room service appropriate? Yes; Fluid consistency: Thin  Diet effective now               Disposition Plan  :   Status is: Inpatient  Remains inpatient appropriate because:Inpatient level of care appropriate due to severity of illness   Dispo: The patient is from: Home              Anticipated d/c is to: Home              Anticipated d/c date is: 2 days              Patient currently is not medically stable to d/c.  Barriers to discharge: Hypoxia requiring O2 supplementation/complete 5 days of IV  Remdesivir  Antimicorbials  :    Anti-infectives (From admission, onward)   Start     Dose/Rate Route Frequency Ordered Stop   01/13/20 1000  remdesivir 100 mg in sodium chloride 0.9 % 100 mL IVPB     100 mg 200 mL/hr over 30 Minutes Intravenous Daily 01/12/20 2332 01/17/20 0959   01/13/20 0000  remdesivir 200 mg in sodium chloride 0.9% 250 mL IVPB     200 mg 580 mL/hr over 30 Minutes Intravenous Once 01/12/20 2332 01/13/20 0113      Inpatient Medications  Scheduled Meds: . aspirin EC  81 mg Oral QHS  . atorvastatin  20 mg Oral QHS  . benzonatate  200 mg Oral TID  . dexamethasone (DECADRON) injection  6 mg Intravenous Q24H  . enoxaparin (LOVENOX) injection  40 mg Subcutaneous Q24H  . insulin aspart  0-9 Units Subcutaneous TID WC  . insulin detemir  80 Units Subcutaneous Q2200  . sodium chloride flush  3 mL Intravenous Once   Continuous Infusions: . remdesivir 100 mg in NS 100 mL     PRN Meds:.acetaminophen, albuterol, chlorpheniramine-HYDROcodone, ondansetron **OR** ondansetron (ZOFRAN) IV   Time Spent in minutes  25  See all Orders from today for further details   03/14/20 M.D on 01/13/2020 at 11:36 AM  To page go to [email or web address] - use universal password  [facility] -  Office  [phone]    Objective:   Vitals:   01/13/20 0151 01/13/20 0215 01/13/20 0534 01/13/20 0750  BP: 135/87  137/68 (!) 144/86  Pulse: 100 95 89 88  Resp: (!) 28 20 16  (!) 24  Temp: 98.2 F (36.8 C)  97.7 F (36.5 C) (!) 97.5  F (36.4 C)  TempSrc: Oral  Oral Oral  SpO2: 92% 94% 95% 95%  Weight:  106.5 kg    Height:  5\' 10"  (1.778 m)      Wt Readings from Last 3 Encounters:  01/13/20 106.5 kg     Intake/Output Summary (Last 24 hours) at 01/13/2020 1136 Last data filed at 01/13/2020 0900 Gross per 24 hour  Intake 680 ml  Output --  Net 680 ml     Physical Exam Gen Exam:Alert awake-not in any distress HEENT:atraumatic, normocephalic Chest: B/L clear to  auscultation anteriorly CVS:S1S2 regular Abdomen:soft non tender, non distended Extremities:no edema Neurology: Non focal Skin: no rash   Data Review:    CBC Recent Labs  Lab 01/12/20 1800 01/13/20 0543 01/13/20 0644  WBC 6.8 7.4 6.4  HGB 15.2 13.2 13.5  HCT 47.7 41.2 41.4  PLT 427* 362 362  MCV 90.9 91.2 90.8  MCH 29.0 29.2 29.6  MCHC 31.9 32.0 32.6  RDW 13.7 13.7 13.7  LYMPHSABS  --   --  0.6*  MONOABS  --   --  0.4  EOSABS  --   --  0.0  BASOSABS  --   --  0.0    Chemistries  Recent Labs  Lab 01/12/20 1800 01/13/20 0543 01/13/20 0644  NA 137  --  133*  K 4.6  --  5.5*  CL 97*  --  95*  CO2 25  --  25  GLUCOSE 230*  --  331*  BUN 23*  --  28*  CREATININE 1.31* 1.39* 1.41*  CALCIUM 8.8*  --  8.3*  AST  --   --  34  ALT  --   --  23  ALKPHOS  --   --  51  BILITOT  --   --  0.9   ------------------------------------------------------------------------------------------------------------------ Recent Labs    01/12/20 2010  TRIG 124    No results found for: HGBA1C ------------------------------------------------------------------------------------------------------------------ No results for input(s): TSH, T4TOTAL, T3FREE, THYROIDAB in the last 72 hours.  Invalid input(s): FREET3 ------------------------------------------------------------------------------------------------------------------ Recent Labs    01/12/20 2010 01/13/20 0644  FERRITIN 341* 327    Coagulation profile No results for input(s): INR, PROTIME in the last 168 hours.  Recent Labs    01/12/20 2010 01/13/20 0644  DDIMER >20.00* >20.00*    Cardiac Enzymes No results for input(s): CKMB, TROPONINI, MYOGLOBIN in the last 168 hours.  Invalid input(s): CK ------------------------------------------------------------------------------------------------------------------ No results found for: BNP  Micro Results Recent Results (from the past 240 hour(s))  Respiratory Panel by  RT PCR (Flu A&B, Covid) - Nasopharyngeal Swab     Status: Abnormal   Collection Time: 01/12/20  8:11 PM   Specimen: Nasopharyngeal Swab  Result Value Ref Range Status   SARS Coronavirus 2 by RT PCR POSITIVE (A) NEGATIVE Final    Comment: RESULT CALLED TO, READ BACK BY AND VERIFIED WITH: P PEICKER 01/12/20 2214 JDW (NOTE) SARS-CoV-2 target nucleic acids are DETECTED. SARS-CoV-2 RNA is generally detectable in upper respiratory specimens  during the acute phase of infection. Positive results are indicative of the presence of the identified virus, but do not rule out bacterial infection or co-infection with other pathogens not detected by the test. Clinical correlation with patient history and other diagnostic information is necessary to determine patient infection status. The expected result is Negative. Fact Sheet for Patients:  2215 Fact Sheet for Healthcare Providers: https://www.moore.com/ This test is not yet approved or cleared by the https://www.young.biz/ FDA and  has  been authorized for detection and/or diagnosis of SARS-CoV-2 by FDA under an Emergency Use Authorization (EUA).  This EUA will remain in effect (meaning this test can be used) for the dur ation of  the COVID-19 declaration under Section 564(b)(1) of the Act, 21 U.S.C. section 360bbb-3(b)(1), unless the authorization is terminated or revoked sooner.    Influenza A by PCR NEGATIVE NEGATIVE Final   Influenza B by PCR NEGATIVE NEGATIVE Final    Comment: (NOTE) The Xpert Xpress SARS-CoV-2/FLU/RSV assay is intended as an aid in  the diagnosis of influenza from Nasopharyngeal swab specimens and  should not be used as a sole basis for treatment. Nasal washings and  aspirates are unacceptable for Xpert Xpress SARS-CoV-2/FLU/RSV  testing. Fact Sheet for Patients: PinkCheek.be Fact Sheet for Healthcare  Providers: GravelBags.it This test is not yet approved or cleared by the [place] FDA and  has been authorized for detection and/or diagnosis of SARS-CoV-2 by  FDA under an Emergency Use Authorization (EUA). This EUA will remain  in effect (meaning this test can be used) for the duration of the  Covid-19 declaration under Section 564(b)(1) of the Act, 21  U.S.C. section 360bbb-3(b)(1), unless the authorization is  terminated or revoked. Performed at [facility], [place] [address], [place], [place] [address]   Blood Culture (routine x 2)     Status: None (Preliminary result)   Collection Time: 01/12/20  8:35 PM   Specimen: BLOOD  Result Value Ref Range Status   Specimen Description BLOOD LEFT ANTECUBITAL  Final   Special Requests   Final    BOTTLES DRAWN AEROBIC AND ANAEROBIC Blood Culture results may not be optimal due to an inadequate volume of blood received in culture bottles   Culture   Final    NO GROWTH < 12 HOURS Performed at [facility], [place] [address], [place], [place] [address]    Report Status PENDING  Incomplete  Blood Culture (routine x 2)     Status: None (Preliminary result)   Collection Time: 01/12/20  8:35 PM   Specimen: BLOOD  Result Value Ref Range Status   Specimen Description BLOOD RIGHT ARM  Final   Special Requests   Final    BOTTLES DRAWN AEROBIC AND ANAEROBIC Blood Culture adequate volume   Culture   Final    NO GROWTH < 12 HOURS Performed at [facility], [place] [address], [place], [place] [address]    Report Status PENDING  Incomplete    Radiology Reports DG Chest Port 1 View  Result Date: 01/12/2020 CLINICAL DATA:  Short of breath, COVID-19 positive EXAM: PORTABLE CHEST 1 VIEW COMPARISON:  None. FINDINGS: Single frontal view of the chest demonstrates an unremarkable cardiac silhouette. There is patchy bilateral airspace disease greatest at the left lung base. No effusion or pneumothorax. No acute bony  abnormalities. IMPRESSION: 1. Bilateral airspace disease greatest at the left lung base, consistent with multifocal pneumonia. This is certainly compatible with history of COVID-19. Electronically Signed   By: Randa Ngo M.D.   On: 01/12/2020 20:59

## 2020-01-13 NOTE — Progress Notes (Signed)
Inpatient Diabetes Program Recommendations  AACE/ADA: New Consensus Statement on Inpatient Glycemic Control (2015)  Target Ranges:  Prepandial:   less than 140 mg/dL      Peak postprandial:   less than 180 mg/dL (1-2 hours)      Critically ill patients:  140 - 180 mg/dL   Lab Results  Component Value Date   GLUCAP 314 (H) 01/13/2020    Review of Glycemic Control  Diabetes history: type 2 Outpatient Diabetes medications: Levemir 80 units at HS, Novolog 25 units at breakfast, 5 units at lunch, 25 units at supper, Januvia 100 mg daily Current orders for Inpatient glycemic control: Levemir 80 units at HS, Novolog SENSITIVE correction scale TID  Inpatient Diabetes Program Recommendations:   Noted that patient's blood sugars have been greater than 200 mg/dl. On steroids.  Recommend increasing Novolog to RESISTANT correction scale TID and add HS scale if blood sugars continue to be elevated.  Will continue to monitor blood sugars while in the hospital.  Smith Mince RN BSN CDE Diabetes Coordinator Pager: [phone]  8am-5pm

## 2020-01-13 NOTE — Progress Notes (Signed)
Lower venous duplex       has been completed. Preliminary results can be found under CV proc through chart review. Bernie Fobes, BS, RDMS, RVT   

## 2020-01-14 ENCOUNTER — Inpatient Hospital Stay (HOSPITAL_COMMUNITY): Payer: BC Managed Care – PPO

## 2020-01-14 DIAGNOSIS — J9600 Acute respiratory failure, unspecified whether with hypoxia or hypercapnia: Secondary | ICD-10-CM | POA: Diagnosis not present

## 2020-01-14 DIAGNOSIS — J9601 Acute respiratory failure with hypoxia: Secondary | ICD-10-CM | POA: Diagnosis not present

## 2020-01-14 DIAGNOSIS — U071 COVID-19: Principal | ICD-10-CM

## 2020-01-14 DIAGNOSIS — E1165 Type 2 diabetes mellitus with hyperglycemia: Secondary | ICD-10-CM | POA: Diagnosis not present

## 2020-01-14 DIAGNOSIS — J1282 Pneumonia due to coronavirus disease 2019: Secondary | ICD-10-CM | POA: Diagnosis not present

## 2020-01-14 DIAGNOSIS — R0902 Hypoxemia: Secondary | ICD-10-CM | POA: Diagnosis not present

## 2020-01-14 DIAGNOSIS — I2699 Other pulmonary embolism without acute cor pulmonale: Secondary | ICD-10-CM | POA: Diagnosis not present

## 2020-01-14 LAB — COMPREHENSIVE METABOLIC PANEL
ALT: 20 U/L (ref 0–44)
AST: 29 U/L (ref 15–41)
Albumin: 2.2 g/dL — ABNORMAL LOW (ref 3.5–5.0)
Alkaline Phosphatase: 48 U/L (ref 38–126)
Anion gap: 13 (ref 5–15)
BUN: 29 mg/dL — ABNORMAL HIGH (ref 6–20)
CO2: 22 mmol/L (ref 22–32)
Calcium: 8.5 mg/dL — ABNORMAL LOW (ref 8.9–10.3)
Chloride: 99 mmol/L (ref 98–111)
Creatinine, Ser: 1.15 mg/dL (ref 0.61–1.24)
GFR calc Af Amer: >60 mL/min (ref >60)
GFR calc non Af Amer: >60 mL/min (ref >60)
Glucose, Bld: 281 mg/dL — ABNORMAL HIGH (ref 70–99)
Potassium: 4.7 mmol/L (ref 3.5–5.1)
Sodium: 134 mmol/L — ABNORMAL LOW (ref 135–145)
Total Bilirubin: 0.7 mg/dL (ref 0.3–1.2)
Total Protein: 6.4 g/dL — ABNORMAL LOW (ref 6.5–8.1)

## 2020-01-14 LAB — CBC
HCT: 41.3 % (ref 39.0–52.0)
Hemoglobin: 13.3 g/dL (ref 13.0–17.0)
MCH: 28.7 pg (ref 26.0–34.0)
MCHC: 32.2 g/dL (ref 30.0–36.0)
MCV: 89 fL (ref 80.0–100.0)
Platelets: 383 10*3/uL (ref 150–400)
RBC: 4.64 MIL/uL (ref 4.22–5.81)
RDW: 13.4 % (ref 11.5–15.5)
WBC: 9.1 10*3/uL (ref 4.0–10.5)
nRBC: 0 % (ref 0.0–0.2)

## 2020-01-14 LAB — HEMOGLOBIN A1C
Hgb A1c MFr Bld: 9.6 % — ABNORMAL HIGH (ref 4.8–5.6)
Mean Plasma Glucose: 228.82 mg/dL

## 2020-01-14 LAB — GLUCOSE, CAPILLARY
Glucose-Capillary: 253 mg/dL — ABNORMAL HIGH (ref 70–99)
Glucose-Capillary: 220 mg/dL — ABNORMAL HIGH (ref 70–99)
Glucose-Capillary: 303 mg/dL — ABNORMAL HIGH (ref 70–99)
Glucose-Capillary: 352 mg/dL — ABNORMAL HIGH (ref 70–99)
Glucose-Capillary: 199 mg/dL — ABNORMAL HIGH (ref 70–99)

## 2020-01-14 LAB — FERRITIN: Ferritin: 320 ng/mL (ref 24–336)

## 2020-01-14 LAB — ECHOCARDIOGRAM COMPLETE
Height: 70 in
Weight: 3755.2 oz

## 2020-01-14 LAB — C-REACTIVE PROTEIN: CRP: 6.8 mg/dL — ABNORMAL HIGH (ref <1.0)

## 2020-01-14 LAB — D-DIMER, QUANTITATIVE: D-Dimer, Quant: 11.48 ug/mL-FEU — ABNORMAL HIGH (ref 0.00–0.50)

## 2020-01-14 MED ORDER — RIVAROXABAN 20 MG PO TABS: 20.0000 mg | ORAL_TABLET | Freq: Every day | ORAL | Status: DC

## 2020-01-14 MED ORDER — INSULIN ASPART 100 UNIT/ML ~~LOC~~ SOLN
10.0000 [IU] | Freq: Three times a day (TID) | SUBCUTANEOUS | Status: DC
  Administered 2020-01-14 – 2020-01-15 (×3): 10 [IU] via SUBCUTANEOUS

## 2020-01-14 MED ORDER — BACLOFEN 10 MG PO TABS: 5.0000 mg | ORAL_TABLET | Freq: Three times a day (TID) | ORAL | Status: DC | PRN

## 2020-01-14 MED ORDER — PANTOPRAZOLE SODIUM 40 MG PO TBEC
40.0000 mg | DELAYED_RELEASE_TABLET | Freq: Every day | ORAL | Status: DC
  Administered 2020-01-14 – 2020-01-15 (×2): 40 mg via ORAL
  Filled 2020-01-14 (×3): qty 1

## 2020-01-14 MED ORDER — RIVAROXABAN 15 MG PO TABS
15.0000 mg | ORAL_TABLET | Freq: Two times a day (BID) | ORAL | Status: DC
  Administered 2020-01-14 – 2020-01-15 (×2): 15 mg via ORAL
  Filled 2020-01-14 (×2): qty 1

## 2020-01-14 NOTE — Progress Notes (Signed)
  Echocardiogram 2D Echocardiogram has been performed.  Autry Droege A Heydy Montilla 01/14/2020, 11:15 AM

## 2020-01-14 NOTE — Evaluation (Signed)
Physical Therapy Evaluation Patient Details Name: Mark Boyer MRN: [id number] DOB: March 22, 1961 Today's Date: 01/14/2020   History of Present Illness  59 year old male +COVID 01/01/20, admitted 01/12/20 with SOB. Patient with acute hypoxic respiratory failure and hypoxemia secondary to COVID PNA. Steroids and Remdesivir started 01/12/20. Patient also found to have PE and BLEs DVT-->started on Lovenox. PMH: DM2, HLD    Clinical Impression  Patient presents with impaired balance and baseline visual deficits from detached retina L eye x 4 years. Patient also with overall weakness from COVID illness. He is unsteady requiring hands on assistance, improved with use of RW. Recommend continued skilled PT services and discharge home with family to assist and home PT services, use of RW. It appears patient needs suppl oxygen for longer distance mobility. Oxygen saturation 93% at rest on room air, HR 77 bpm, RR 16 --> down to 86% during ambulation on room air but recovers with seated rest in approx 10 seconds to >/=90%.    Follow Up Recommendations Home health PT;Supervision/Assistance - 24 hour    Equipment Recommendations  Rolling walker with 5" wheels       Precautions / Restrictions Precautions Precautions: Fall;Other (comment) Precaution Comments: impaired vision baseline due to detached retina L eye Restrictions Weight Bearing Restrictions: No      Mobility  Bed Mobility General bed mobility comments: Patient OOB in chair upon PT arrival.  Transfers Overall transfer level: Needs assistance Equipment used: None;Rolling walker (2 wheeled) Transfers: Sit to/from Stand Sit to Stand: Min guard;Supervision General transfer comment: Close supervision/contact for sit<>stand from chair with and without RW, cues for hand placement with RW  Ambulation/Gait Ambulation/Gait assistance: Min assist;Min guard Gait Distance (Feet): 10 [id number]) Assistive device: None;Rolling walker (2 wheeled) Gait  Pattern/deviations: Step-through pattern;Staggering left;Staggering right Gait velocity: decreased, especially without use of AD   General Gait Details: Patient unsteady, slow gait, staggers, without use of AD requiring minA for balance. Gait training with RW approx 124ft on room air with improvement in gait quality. Cues for safety with RW management on turns.  Stairs Stairs: Yes Stairs assistance: Min assist;Min guard Stair Management: One rail Left;One rail Right((L then R rail)) Number of Stairs: 2 General stair comments: Cues for technique. Education to have someone assist him into the home.   Balance Overall balance assessment: Needs assistance  Standing balance support: No upper extremity supported Standing balance-Leahy Scale: Fair      Pertinent Vitals/Pain Pain Assessment: No/denies pain    Home Living Family/patient expects to be discharged to:: Private residence Living Arrangements: Spouse/significant other;Parent;Other relatives(wife, mom, dad in/out, sister in/out, niece in/out) Available Help at Discharge: Family Type of Home: Mobile home Home Access: Stairs to enter Entrance Stairs-Rails: (front has two rails, back on single rail) Entrance Stairs-Number of Steps: 4-5 steps Home Layout: One level Home Equipment: Shower seat - built in;Grab bars - tub/shower;Hand held shower head(parents use walkers) Additional Comments: All family is positive for COVID but doing better than patient (per patient report), wife is out of work right now    Prior Function Level of Independence: Independent   Comments: sister was assisting patient just PTA due to patient's weakness from COVID, mows yards for a living, driving, on fire department, L retinal detachment 4 years ago        Extremity/Trunk Assessment     Lower Extremity Assessment Lower Extremity Assessment: Generalized weakness(BLE strength grossly 4+/5)   Communication   Communication: No difficulties  Cognition  Arousal/Alertness: Awake/alert Behavior During Therapy:  WFL for tasks assessed/performed Overall Cognitive Status: Within Functional Limits for tasks assessed    General Comments General comments (skin integrity, edema, etc.): Decreased L peripheral vision. Patient reports also baseline impaired central vision L eye.        Assessment/Plan    PT Assessment Patient needs continued PT services  PT Problem List Decreased strength;Decreased activity tolerance;Decreased balance;Decreased mobility;Decreased knowledge of use of DME;Cardiopulmonary status limiting activity       PT Treatment Interventions DME instruction;Gait training;Stair training;Functional mobility training;Therapeutic activities;Therapeutic exercise;Balance training;Patient/family education    PT Goals (Current goals can be found in the Care Plan section)  Acute Rehab PT Goals Patient Stated Goal: to go home PT Goal Formulation: With patient Time For Goal Achievement: 01/27/20 Potential to Achieve Goals: Good    Frequency Min 3X/week   Barriers to discharge   no anticipate barriers to discharge, patient denies having any concerns about his mobility for discharge home and agreeable to PT recommendations       AM-PAC PT "6 Clicks" Mobility  Outcome Measure Help needed turning from your back to your side while in a flat bed without using bedrails?: None Help needed moving from lying on your back to sitting on the side of a flat bed without using bedrails?: None Help needed moving to and from a bed to a chair (including a wheelchair)?: A Little Help needed standing up from a chair using your arms (e.g., wheelchair or bedside chair)?: A Little Help needed to walk in hospital room?: A Little Help needed climbing 3-5 steps with a railing? : A Little 6 Click Score: 20    End of Session   Activity Tolerance: Patient tolerated treatment well Patient left: in chair;with call bell/phone within reach Nurse  Communication: Mobility status PT Visit Diagnosis: Unsteadiness on feet (R26.81);Other abnormalities of gait and mobility (R26.89);Muscle weakness (generalized) (M62.81)    Time: 2751-7001 PT Time Calculation (min) (ACUTE ONLY): 31 min   Charges:   PT Evaluation $PT Eval Moderate Complexity: 1 Mod          Birdie Hopes, PT, DPT Acute Rehab [phone] office    Birdie Hopes 01/14/2020, 3:28 PM

## 2020-01-14 NOTE — Plan of Care (Signed)
  Problem: Education: Goal: Knowledge of General Education information will improve Description: Including pain rating scale, medication(s)/side effects and non-pharmacologic comfort measures Outcome: Progressing   Problem: Health Behavior/Discharge Planning: Goal: Ability to manage health-related needs will improve Outcome: Progressing   Problem: Clinical Measurements: Goal: Ability to maintain clinical measurements within normal limits will improve Outcome: Progressing Goal: Will remain free from infection Outcome: Progressing Goal: Diagnostic test results will improve Outcome: Progressing Goal: Respiratory complications will improve Outcome: Progressing Goal: Cardiovascular complication will be avoided Outcome: Progressing   Problem: Activity: Goal: Risk for activity intolerance will decrease Outcome: Progressing   Problem: Nutrition: Goal: Adequate nutrition will be maintained Outcome: Progressing   Problem: Coping: Goal: Level of anxiety will decrease Outcome: Progressing   Problem: Elimination: Goal: Will not experience complications related to bowel motility Outcome: Progressing Goal: Will not experience complications related to urinary retention Outcome: Progressing   Problem: Pain Managment: Goal: General experience of comfort will improve Outcome: Progressing   Problem: Safety: Goal: Ability to remain free from injury will improve Outcome: Progressing   Problem: Skin Integrity: Goal: Risk for impaired skin integrity will decrease Outcome: Progressing   Problem: Education: Goal: Knowledge of risk factors and measures for prevention of condition will improve Outcome: Progressing   Problem: Respiratory: Goal: Will maintain a patent airway Outcome: Progressing Goal: Complications related to the disease process, condition or treatment will be avoided or minimized Outcome: Progressing   

## 2020-01-14 NOTE — Progress Notes (Signed)
PROGRESS NOTE                                                                                                                                                                                                             Patient Demographics:    Mark Boyer, is a 59 y.o. male, DOB - Jun 18, 1961, VVO:[id number]  Outpatient Primary MD for the patient is Practice, [facility]   Admit date - 01/12/2020   LOS - 1  Chief Complaint  Patient presents with  . Shortness of Breath       Brief Narrative: Patient is a 59 y.o. male with PMHx of insulin-dependent DM-2, HLD-who was positive for COVID-19 on 4/23-presented with shortness of breath-found to have acute hypoxic respiratory failure and subsequently admitted to the hospitalist service.  See below for further details.  Significant Events: 5/4>> admit to [facility] for hypoxemia secondary to COVID-19 pneumonia  COVID-19 medications: Steroids: 5/4>> Remdesivir: 5/4>>  Antibiotics: None  Microbiology data: 5/4: Blood cultures>> negative so far  DVT prophylaxis: SQ Lovenox  Procedures: None  Consults: None    Subjective:   Feels better-has some hiccups this morning.   Assessment  & Plan :   Acute Hypoxic Resp Failure due to Covid 19 Viral pneumonia and pulmonary embolism: Briefly on room air this morning-but-Per nursing staff-requires 2-3 L with ambulation.  Continue steroids/remdesivir and anticoagulation.  Continue attempts to titrate down FiO2-probably will require home O2 on discharge.  If clinical improvement continues-we can contemplate discharge to complete remdesivir in the infusion center at [facility].   Prone/Incentive Spirometry: encouraged  incentive spirometry use 3-4/hour.  Fever: afebrile  O2 requirements:  SpO2: 92 % O2 Flow Rate (L/min): 2 L/min   COVID-19 Labs: Recent Labs    01/12/20 2010 01/13/20 0644 01/14/20 0240    DDIMER >20.00* >20.00* 11.48*  FERRITIN 341* 327 320  LDH 441*  --   --   CRP 11.5* 9.2* 6.8*    No results found for: BNP  Recent Labs  Lab 01/12/20 2010  PROCALCITON 0.29    Lab Results  Component Value Date   SARSCOV2NAA POSITIVE (A) 01/12/2020    Pulmonary embolism with bilateral lower extremity DVT: Started on Lovenox-we will transition to Xarelto today.  AKI: Resolved-likely hemodynamically mediated-given resolution of AKI-do not think patient had CKD stage III.  HLD: Continue statin  Insulin-dependent DM-2 with uncontrolled hyperglycemia secondary to steroids: CBGs slowly improving-increase NovoLog to 10 units with meals, continue resistant SSI-continue 8 units of Lantus-follow and adjust.    Recent Labs    01/13/20 2113 01/14/20 0528 01/14/20 0725  GLUCAP 411* 253* 220*    Obesity: Estimated body mass index is 33.68 kg/m as calculated from the following:   Height as of this encounter: 5\' 10"  (1.778 m).   Weight as of this encounter: 106.5 kg.     ABG: No results found for: PHART, PCO2ART, PO2ART, HCO3, TCO2, ACIDBASEDEF, O2SAT  Vent Settings: N/A    Condition - Stable  Family Communication  :  Patient will update spouse himself-have asked him to let me know if his family wants to talk to a MD  Code Status :  Full Code  Diet :  Diet Order            Diet heart healthy/carb modified Room service appropriate? Yes; Fluid consistency: Thin  Diet effective now               Disposition Plan  :   Status is: Inpatient  Remains inpatient appropriate because:Inpatient level of care appropriate due to severity of illness   Dispo: The patient is from: Home              Anticipated d/c is to: Home              Anticipated d/c date is: 2 days              Patient currently is not medically stable to d/c.  Barriers to discharge: Hypoxia requiring O2 supplementation/complete 5 days of IV Remdesivir  Antimicorbials  :    Anti-infectives (From  admission, onward)   Start     Dose/Rate Route Frequency Ordered Stop   01/14/20 1000  remdesivir 100 mg in sodium chloride 0.9 % 100 mL IVPB     100 mg 200 mL/hr over 30 Minutes Intravenous Daily 01/12/20 2332 01/18/20 0959   01/13/20 0000  remdesivir 200 mg in sodium chloride 0.9% 250 mL IVPB     200 mg 580 mL/hr over 30 Minutes Intravenous Once 01/12/20 2332 01/13/20 0113      Inpatient Medications  Scheduled Meds: . aspirin EC  81 mg Oral QHS  . atorvastatin  20 mg Oral QHS  . benzonatate  200 mg Oral TID  . dexamethasone (DECADRON) injection  6 mg Intravenous Q24H  . enoxaparin (LOVENOX) injection  110 mg Subcutaneous Q12H  . insulin aspart  0-20 Units Subcutaneous TID WC  . insulin aspart  0-5 Units Subcutaneous QHS  . insulin aspart  6 Units Subcutaneous TID WC  . insulin detemir  80 Units Subcutaneous Q2200  . sodium chloride flush  3 mL Intravenous Once   Continuous Infusions: . remdesivir 100 mg in NS 100 mL 100 mg (01/14/20 0818)   PRN Meds:.acetaminophen, albuterol, chlorpheniramine-HYDROcodone, ondansetron **OR** ondansetron (ZOFRAN) IV   Time Spent in minutes  25  See all Orders from today for further details   03/15/20 M.D on 01/14/2020 at 10:35 AM  To page go to [email or web address] - use universal password  [facility] -  Office  [phone]    Objective:   Vitals:   01/13/20 2352 01/14/20 0146 01/14/20 0526 01/14/20 0800  BP:   121/66   Pulse: 83 77  89  Resp: 19 (!) 21  20  Temp:   97.8 F (36.6 C)  TempSrc:   Oral   SpO2: 94% 92%  92%  Weight:      Height:        Wt Readings from Last 3 Encounters:  01/13/20 106.5 kg     Intake/Output Summary (Last 24 hours) at 01/14/2020 1035 Last data filed at 01/14/2020 0847 Gross per 24 hour  Intake 360 ml  Output 800 ml  Net -440 ml     Physical Exam Gen Exam:Alert awake-not in any distress HEENT:atraumatic, normocephalic Chest: B/L clear to auscultation anteriorly CVS:S1S2  regular Abdomen:soft non tender, non distended Extremities:no edema Neurology: Non focal Skin: no rash   Data Review:    CBC Recent Labs  Lab 01/12/20 1800 01/13/20 0543 01/13/20 0644 01/14/20 0240  WBC 6.8 7.4 6.4 9.1  HGB 15.2 13.2 13.5 13.3  HCT 47.7 41.2 41.4 41.3  PLT 427* 362 362 383  MCV 90.9 91.2 90.8 89.0  MCH 29.0 29.2 29.6 28.7  MCHC 31.9 32.0 32.6 32.2  RDW 13.7 13.7 13.7 13.4  LYMPHSABS  --   --  0.6*  --   MONOABS  --   --  0.4  --   EOSABS  --   --  0.0  --   BASOSABS  --   --  0.0  --     Chemistries  Recent Labs  Lab 01/12/20 1800 01/13/20 0543 01/13/20 0644 01/14/20 0240  NA 137  --  133* 134*  K 4.6  --  5.5* 4.7  CL 97*  --  95* 99  CO2 25  --  25 22  GLUCOSE 230*  --  331* 281*  BUN 23*  --  28* 29*  CREATININE 1.31* 1.39* 1.41* 1.15  CALCIUM 8.8*  --  8.3* 8.5*  AST  --   --  34 29  ALT  --   --  23 20  ALKPHOS  --   --  51 48  BILITOT  --   --  0.9 0.7   ------------------------------------------------------------------------------------------------------------------ Recent Labs    01/12/20 2010  TRIG 124    Lab Results  Component Value Date   HGBA1C 9.6 (H) 01/14/2020   ------------------------------------------------------------------------------------------------------------------ No results for input(s): TSH, T4TOTAL, T3FREE, THYROIDAB in the last 72 hours.  Invalid input(s): FREET3 ------------------------------------------------------------------------------------------------------------------ Recent Labs    01/13/20 0644 01/14/20 0240  FERRITIN 327 320    Coagulation profile No results for input(s): INR, PROTIME in the last 168 hours.  Recent Labs    01/13/20 0644 01/14/20 0240  DDIMER >20.00* 11.48*    Cardiac Enzymes No results for input(s): CKMB, TROPONINI, MYOGLOBIN in the last 168 hours.  Invalid input(s):  CK ------------------------------------------------------------------------------------------------------------------ No results found for: BNP  Micro Results Recent Results (from the past 240 hour(s))  Respiratory Panel by RT PCR (Flu A&B, Covid) - Nasopharyngeal Swab     Status: Abnormal   Collection Time: 01/12/20  8:11 PM   Specimen: Nasopharyngeal Swab  Result Value Ref Range Status   SARS Coronavirus 2 by RT PCR POSITIVE (A) NEGATIVE Final    Comment: RESULT CALLED TO, READ BACK BY AND VERIFIED WITH: P PEICKER 01/12/20 2214 JDW (NOTE) SARS-CoV-2 target nucleic acids are DETECTED. SARS-CoV-2 RNA is generally detectable in upper respiratory specimens  during the acute phase of infection. Positive results are indicative of the presence of the identified virus, but do not rule out bacterial infection or co-infection with other pathogens not detected by the test. Clinical correlation with patient history and other diagnostic information is necessary  to determine patient infection status. The expected result is Negative. Fact Sheet for Patients:  https://www.moore.com/ Fact Sheet for Healthcare Providers: https://www.young.biz/ This test is not yet approved or cleared by the [place] FDA and  has been authorized for detection and/or diagnosis of SARS-CoV-2 by FDA under an Emergency Use Authorization (EUA).  This EUA will remain in effect (meaning this test can be used) for the dur ation of  the COVID-19 declaration under Section 564(b)(1) of the Act, 21 U.S.C. section 360bbb-3(b)(1), unless the authorization is terminated or revoked sooner.    Influenza A by PCR NEGATIVE NEGATIVE Final   Influenza B by PCR NEGATIVE NEGATIVE Final    Comment: (NOTE) The Xpert Xpress SARS-CoV-2/FLU/RSV assay is intended as an aid in  the diagnosis of influenza from Nasopharyngeal swab specimens and  should not be used as a sole basis for treatment. Nasal  washings and  aspirates are unacceptable for Xpert Xpress SARS-CoV-2/FLU/RSV  testing. Fact Sheet for Patients: https://www.moore.com/ Fact Sheet for Healthcare Providers: https://www.young.biz/ This test is not yet approved or cleared by the [place] FDA and  has been authorized for detection and/or diagnosis of SARS-CoV-2 by  FDA under an Emergency Use Authorization (EUA). This EUA will remain  in effect (meaning this test can be used) for the duration of the  Covid-19 declaration under Section 564(b)(1) of the Act, 21  U.S.C. section 360bbb-3(b)(1), unless the authorization is  terminated or revoked. Performed at [facility], [address], [place], [place] [address]   Blood Culture (routine x 2)     Status: None (Preliminary result)   Collection Time: 01/12/20  8:35 PM   Specimen: BLOOD  Result Value Ref Range Status   Specimen Description BLOOD LEFT ANTECUBITAL  Final   Special Requests   Final    BOTTLES DRAWN AEROBIC AND ANAEROBIC Blood Culture results may not be optimal due to an inadequate volume of blood received in culture bottles   Culture   Final    NO GROWTH 2 DAYS Performed at [facility], [address], [place], [place] [address]    Report Status PENDING  Incomplete  Blood Culture (routine x 2)     Status: None (Preliminary result)   Collection Time: 01/12/20  8:35 PM   Specimen: BLOOD  Result Value Ref Range Status   Specimen Description BLOOD RIGHT ARM  Final   Special Requests   Final    BOTTLES DRAWN AEROBIC AND ANAEROBIC Blood Culture adequate volume   Culture   Final    NO GROWTH 2 DAYS Performed at [facility], [address], [place], [place] [address]    Report Status PENDING  Incomplete    Radiology Reports CT ANGIO CHEST PE W OR WO CONTRAST  Addendum Date: 01/13/2020   ADDENDUM REPORT: 01/13/2020 13:10 ADDENDUM: Critical Value/emergent results were called by telephone at the time  of interpretation on 01/13/2020 at 1:o0 pm to provider Dr. Jerral Ralph, who verbally acknowledged these results. Electronically Signed   By: Lupita Raider M.D.   On: 01/13/2020 13:10   Result Date: 01/13/2020 CLINICAL DATA:  Shortness of breath. EXAM: CT ANGIOGRAPHY CHEST WITH CONTRAST TECHNIQUE: Multidetector CT imaging of the chest was performed using the standard protocol during bolus administration of intravenous contrast. Multiplanar CT image reconstructions and MIPs were obtained to evaluate the vascular anatomy. CONTRAST:  23mL OMNIPAQUE IOHEXOL 350 MG/ML SOLN COMPARISON:  None. FINDINGS: Cardiovascular: Filling defect is noted in lower lobe branch  of right pulmonary artery consistent with pulmonary embolus. Normal cardiac size. No pericardial effusion. Coronary artery calcifications are noted. Atherosclerosis of thoracic aorta is noted without aneurysm formation. Mediastinum/Nodes: No enlarged mediastinal, hilar, or axillary lymph nodes. Thyroid gland, trachea, and esophagus demonstrate no significant findings. Lungs/Pleura: No pneumothorax or pleural effusion is noted. Multiple airspace opacities are noted throughout both lungs consistent with multifocal pneumonia, potentially of viral etiology. Upper Abdomen: No acute abnormality. Musculoskeletal: No chest wall abnormality. No acute or significant osseous findings. Review of the MIP images confirms the above findings. IMPRESSION: 1. Filling defect is noted in lower lobe branch of right pulmonary artery consistent with pulmonary embolus. 2. Coronary artery calcifications are noted suggesting coronary artery disease. 3. Multiple airspace opacities are noted throughout both lungs consistent with multifocal pneumonia, potentially of viral etiology. Aortic Atherosclerosis (ICD10-I70.0). Electronically Signed: By: Lupita RaiderJames  Green Jr M.D. On: 01/13/2020 12:43   DG Chest Port 1 View  Result Date: 01/12/2020 CLINICAL DATA:  Short of breath, COVID-19 positive EXAM:  PORTABLE CHEST 1 VIEW COMPARISON:  None. FINDINGS: Single frontal view of the chest demonstrates an unremarkable cardiac silhouette. There is patchy bilateral airspace disease greatest at the left lung base. No effusion or pneumothorax. No acute bony abnormalities. IMPRESSION: 1. Bilateral airspace disease greatest at the left lung base, consistent with multifocal pneumonia. This is certainly compatible with history of COVID-19. Electronically Signed   By: Sharlet SalinaMichael  Brown M.D.   On: 01/12/2020 20:59   VAS US LOWER EXTREMITY VENOUS (DVT)  Result Date: 01/13/2020  Lower Venous DVTStudy Indications: D dimer.  Comparison Study: no prior Performing Technologist: Jeb LeveringJill Parker RDMS, RVT  Examination Guidelines: A complete evaluation includes B-mode imaging, spectral Doppler, color Doppler, and power Doppler as needed of all accessible portions of each vessel. Bilateral testing is considered an integral part of a complete examination. Limited examinations for reoccurring indications may be performed as noted. The reflux portion of the exam is performed with the patient in reverse Trendelenburg.  +---------+---------------+---------+-----------+----------+--------------+ RIGHT    CompressibilityPhasicitySpontaneityPropertiesThrombus Aging +---------+---------------+---------+-----------+----------+--------------+ CFV                                                   Not visualized +---------+---------------+---------+-----------+----------+--------------+ FV Prox  Full           Yes      Yes                                 +---------+---------------+---------+-----------+----------+--------------+ FV Mid   Full                                                        +---------+---------------+---------+-----------+----------+--------------+ FV DistalFull                                                        +---------+---------------+---------+-----------+----------+--------------+ PFV       Full                                                        +---------+---------------+---------+-----------+----------+--------------+  POP      Full           Yes      Yes                                 +---------+---------------+---------+-----------+----------+--------------+ PTV      None                                         Acute          +---------+---------------+---------+-----------+----------+--------------+ PERO     Full                                                        +---------+---------------+---------+-----------+----------+--------------+ Soleal   None                                         Acute          +---------+---------------+---------+-----------+----------+--------------+   +---------+---------------+---------+-----------+----------+--------------+ LEFT     CompressibilityPhasicitySpontaneityPropertiesThrombus Aging +---------+---------------+---------+-----------+----------+--------------+ CFV                                                   Not visualized +---------+---------------+---------+-----------+----------+--------------+ FV Prox  Full           Yes      Yes                                 +---------+---------------+---------+-----------+----------+--------------+ FV Mid   Full                                                        +---------+---------------+---------+-----------+----------+--------------+ FV DistalFull                                                        +---------+---------------+---------+-----------+----------+--------------+ PFV      Full                                                        +---------+---------------+---------+-----------+----------+--------------+ POP      Full           Yes      Yes                                 +---------+---------------+---------+-----------+----------+--------------+ PTV      Full                                                         +---------+---------------+---------+-----------+----------+--------------+  PERO     Full                                                        +---------+---------------+---------+-----------+----------+--------------+ Soleal   None                                         Acute          +---------+---------------+---------+-----------+----------+--------------+     Summary: RIGHT: - Findings consistent with acute deep vein thrombosis involving the right posterior tibial veins, and right soleal veins. - No cystic structure found in the popliteal fossa.  LEFT: - Findings consistent with acute deep vein thrombosis involving the left soleal veins. - No cystic structure found in the popliteal fossa.  *See table(s) above for measurements and observations. Electronically signed by Coral Else MD on 01/13/2020 at 8:39:37 PM.    Final

## 2020-01-14 NOTE — TOC Initial Note (Addendum)
Transition of Care [facility]) - Initial/Assessment Note    Patient Details  Name: Mark Boyer MRN: [id number] Date of Birth: Apr 18, 1961  Transition of Care [facility]) CM/SW Contact:    Cherylann Parr, RN Phone Number: 01/14/2020, 4:23 PM  Clinical Narrative:   PTA independent from home with wife.  Pt in agreement for home oxygen and RW  as ordered.  CM provided agency choice - [employer] chosen - agency accepts referral.                Expected Discharge Plan: Home/Self Care     Patient Goals and CMS Choice   CMS Medicare.gov Compare Post Acute Care list provided to:: Patient Choice offered to / list presented to : Patient  Expected Discharge Plan and Services Expected Discharge Plan: Home/Self Care     Post Acute Care Choice: Durable Medical Equipment Living arrangements for the past 2 months: Single Family Home                 DME Arranged: Oxygen, Walker rolling([facility]) DME Agency: Other - Comment Date DME Agency Contacted: 01/14/20 Time DME Agency Contacted: 1000 Representative spoke with at DME Agency: Vaughan Basta            Prior Living Arrangements/Services Living arrangements for the past 2 months: Single Family Home Lives with:: Spouse Patient language and need for interpreter reviewed:: Yes Do you feel safe going back to the place where you live?: Yes      Need for Family Participation in Patient Care: No (Comment) Care giver support system in place?: Yes (comment)   Criminal Activity/Legal Involvement Pertinent to Current Situation/Hospitalization: No - Comment as needed  Activities of Daily Living Home Assistive Devices/Equipment: None ADL Screening (condition at time of admission) Patient's cognitive ability adequate to safely complete daily activities?: Yes Is the patient deaf or have difficulty hearing?: No Does the patient have difficulty seeing, even when wearing glasses/contacts?: No Does the patient have difficulty concentrating, remembering, or making  decisions?: No Patient able to express need for assistance with ADLs?: No Does the patient have difficulty dressing or bathing?: No Independently performs ADLs?: Yes (appropriate for developmental age) Does the patient have difficulty walking or climbing stairs?: No Weakness of Legs: None Weakness of Arms/Hands: None  Permission Sought/Granted   Permission granted to share information with : Yes, Verbal Permission Granted              Emotional Assessment   Attitude/Demeanor/Rapport: Self-Confident, Engaged Affect (typically observed): Accepting, Adaptable Orientation: : Oriented to Self, Oriented to Place, Oriented to  Time, Oriented to Situation   Psych Involvement: No (comment)  Admission diagnosis:  Acute respiratory failure with hypoxia (HCC) [J96.01] Pneumonia due to COVID-19 virus [U07.1, J12.82] Patient Active Problem List   Diagnosis Date Noted  . Acute respiratory failure with hypoxia (HCC) 01/13/2020  . Acute respiratory failure due to COVID-19 (HCC) 01/13/2020  . Pneumonia due to COVID-19 virus 01/13/2020  . Uncontrolled type 2 diabetes mellitus with hyperglycemia (HCC) 01/13/2020   PCP:  Practice, [facility]:   [facility] [phone] - [address] [place] - [address] AT [facility] [address] [place] [address] Phone: [phone] Fax: [phone]     Social Determinants of Health (SDOH) Interventions    Readmission Risk Interventions No flowsheet data found.

## 2020-01-14 NOTE — Progress Notes (Signed)
ANTICOAGULATION CONSULT NOTE - Follow Up Consult  Pharmacy Consult for Lovenox > Xarelto Indication: pulmonary embolus and DVT  No Known Allergies  Patient Measurements: Height: 5\' 10"  (177.8 cm) Weight: 106.5 kg (234 lb 11.2 oz) IBW/kg (Calculated) : 73 Lovenox Dosing Weight: 106.5 kg  Vital Signs: Temp: 97.8 F (36.6 C) (05/06 0526) Temp Source: Oral (05/06 0526) BP: 121/66 (05/06 0526) Pulse Rate: 89 (05/06 0800)  Labs: Recent Labs    01/12/20 1800 01/12/20 1800 01/12/20 2038 01/13/20 0543 01/13/20 0543 01/13/20 0644 01/14/20 0240  HGB 15.2   < >  --  13.2   < > 13.5 13.3  HCT 47.7   < >  --  41.2  --  41.4 41.3  PLT 427*   < >  --  362  --  362 383  CREATININE 1.31*   < >  --  1.39*  --  1.41* 1.15  TROPONINIHS 5  --  5  --   --   --   --    < > = values in this interval not displayed.    Estimated Creatinine Clearance: 86.6 mL/min (by C-G formula based on SCr of 1.15 mg/dL).  Assessment:   59 yr old male admitted early am with shortness of breath, COVID-19 pneumonia. Remdesivir and steroids begun.  D-dimer > 20, CTA showed PE and duplex showed bilateral DVT.    Transitioned from prophylactic to treatment dose Lovenox on 5/5. To transition to Xarelto today.  Last Lovenox dose given ~8am today.  Goal of Therapy:  Anti-Xa level 0.6-1 units/ml 4hrs after LMWH dose given appropriate Xarelto dose for indication Monitor platelets by anticoagulation protocol: Yes   Plan:   Stop Lovenox.  Begin Xarelto 15 mg BID x 21 days to begin with supper tonight, then 20 mg daily with supper thereafter.    59, RPh Phone: [phone] 01/14/2020,12:37 PM

## 2020-01-14 NOTE — Progress Notes (Addendum)
Inpatient Diabetes Program Recommendations  AACE/ADA: New Consensus Statement on Inpatient Glycemic Control (2015)  Target Ranges:  Prepandial:   less than 140 mg/dL      Peak postprandial:   less than 180 mg/dL (1-2 hours)      Critically ill patients:  140 - 180 mg/dL   Lab Results  Component Value Date   GLUCAP 220 (H) 01/14/2020   HGBA1C 9.6 (H) 01/14/2020    Review of Glycemic Control Results for GRADIE, OHM (MRN [id number]) as of 01/14/2020 09:14  Ref. Range 01/13/2020 16:22 01/13/2020 21:13 01/14/2020 05:28 01/14/2020 07:25  Glucose-Capillary Latest Ref Range: 70 - 99 mg/dL 967 (H) 591 (H) 638 (H) 220 (H)   Diabetes history: DM 2 Outpatient Diabetes medications: Levemir 80 units at HS, Novolog 25 units at breakfast, 5 units at lunch, 25 units at supper, Januvia 100 mg daily Current orders for Inpatient glycemic control:  Levemir 80 units at HS Novolog 0-20 units tid  Novolog 6 units tid meal coverage  Decadron 6 mg Q24 hours  Inpatient Diabetes Program Recommendations:   Noted that patient's blood sugars have been greater than 200 mg/dl. On steroids.  - Increase Novolog meal coverage to 10 units tid  - Add Levemir 10 units Daily in addition to 80 units in the evening.  - Add Tradjenta 5 mg Daily  Thanks,  Christena Deem RN, MSN, BC-ADM Inpatient Diabetes Coordinator Team Pager [phone] (8a-5p)

## 2020-01-14 NOTE — Progress Notes (Signed)
SATURATION QUALIFICATIONS: (This note is used to comply with regulatory documentation for home oxygen)  Patient Saturations on Room Air at Rest = 92%  Patient Saturations on Room Air while Ambulating = 87%  Patient Saturations on 3 Liters of oxygen while Ambulating = 95%

## 2020-01-15 DIAGNOSIS — I2699 Other pulmonary embolism without acute cor pulmonale: Secondary | ICD-10-CM

## 2020-01-15 LAB — COMPREHENSIVE METABOLIC PANEL
ALT: 18 U/L (ref 0–44)
AST: 29 U/L (ref 15–41)
Albumin: 2.3 g/dL — ABNORMAL LOW (ref 3.5–5.0)
Alkaline Phosphatase: 46 U/L (ref 38–126)
Anion gap: 11 (ref 5–15)
BUN: 20 mg/dL (ref 6–20)
CO2: 27 mmol/L (ref 22–32)
Calcium: 8.8 mg/dL — ABNORMAL LOW (ref 8.9–10.3)
Chloride: 102 mmol/L (ref 98–111)
Creatinine, Ser: 1 mg/dL (ref 0.61–1.24)
GFR calc Af Amer: >60 mL/min (ref >60)
GFR calc non Af Amer: >60 mL/min (ref >60)
Glucose, Bld: 90 mg/dL (ref 70–99)
Potassium: 4.4 mmol/L (ref 3.5–5.1)
Sodium: 140 mmol/L (ref 135–145)
Total Bilirubin: 0.6 mg/dL (ref 0.3–1.2)
Total Protein: 6.4 g/dL — ABNORMAL LOW (ref 6.5–8.1)

## 2020-01-15 LAB — CBC
HCT: 41.7 % (ref 39.0–52.0)
Hemoglobin: 13.4 g/dL (ref 13.0–17.0)
MCH: 28.9 pg (ref 26.0–34.0)
MCHC: 32.1 g/dL (ref 30.0–36.0)
MCV: 89.9 fL (ref 80.0–100.0)
Platelets: 395 10*3/uL (ref 150–400)
RBC: 4.64 MIL/uL (ref 4.22–5.81)
RDW: 13.5 % (ref 11.5–15.5)
WBC: 8.2 10*3/uL (ref 4.0–10.5)
nRBC: 0 % (ref 0.0–0.2)

## 2020-01-15 LAB — GLUCOSE, CAPILLARY
Glucose-Capillary: 85 mg/dL (ref 70–99)
Glucose-Capillary: 196 mg/dL — ABNORMAL HIGH (ref 70–99)

## 2020-01-15 LAB — C-REACTIVE PROTEIN: CRP: 3.1 mg/dL — ABNORMAL HIGH (ref <1.0)

## 2020-01-15 LAB — D-DIMER, QUANTITATIVE: D-Dimer, Quant: 7.88 ug/mL-FEU — ABNORMAL HIGH (ref 0.00–0.50)

## 2020-01-15 LAB — FERRITIN: Ferritin: 335 ng/mL (ref 24–336)

## 2020-01-15 MED ORDER — BENZONATATE 200 MG PO CAPS: 200.0000 mg | ORAL_CAPSULE | Freq: Three times a day (TID) | ORAL | 0 refills | Status: AC | PRN

## 2020-01-15 MED ORDER — RIVAROXABAN (XARELTO) VTE STARTER PACK (15 & 20 MG): ORAL_TABLET | ORAL | 0 refills | Status: AC

## 2020-01-15 MED ORDER — RIVAROXABAN 20 MG PO TABS
20.0000 mg | ORAL_TABLET | Freq: Every day | ORAL | 1 refills | Status: DC
Start: 2020-02-14 — End: 2020-01-15

## 2020-01-15 MED ORDER — RIVAROXABAN 20 MG PO TABS: ORAL_TABLET | ORAL | 1 refills | Status: AC

## 2020-01-15 MED ORDER — DEXAMETHASONE 6 MG PO TABS
6.0000 mg | ORAL_TABLET | Freq: Every day | ORAL | 0 refills | Status: DC
Start: 2020-01-15 — End: 2020-01-15

## 2020-01-15 MED ORDER — DEXAMETHASONE 6 MG PO TABS: 6.0000 mg | ORAL_TABLET | Freq: Every day | ORAL | 0 refills | Status: AC

## 2020-01-15 MED ORDER — BENZONATATE 200 MG PO CAPS: 200.0000 mg | ORAL_CAPSULE | Freq: Three times a day (TID) | ORAL | 0 refills | Status: DC | PRN

## 2020-01-15 MED ORDER — RIVAROXABAN 20 MG PO TABS
20.0000 mg | ORAL_TABLET | Freq: Every day | ORAL | 1 refills | Status: DC
Start: 2020-01-15 — End: 2020-01-15

## 2020-01-15 MED ORDER — RIVAROXABAN (XARELTO) VTE STARTER PACK (15 & 20 MG)
ORAL_TABLET | ORAL | 0 refills | Status: DC
Start: 2020-01-15 — End: 2020-01-15

## 2020-01-15 MED FILL — DEXAMETHASONE 6 MG TABLET: 6 | 5 days supply | Qty: 5 | Fill #0

## 2020-01-15 MED FILL — XARELTO STARTER PACK: 15 & 20 | 30 days supply | Qty: 51 | Fill #0

## 2020-01-15 MED FILL — BENZONATATE 200 MG CAPS: 200 | 7 days supply | Qty: 20 | Fill #0

## 2020-01-15 NOTE — TOC Benefit Eligibility Note (Signed)
Transition of Care [facility]) Benefit Eligibility Note    Patient Details  Name: Mark Boyer MRN: [id number] Date of Birth: [birth year]   Medication/Dose: Carlena Hurl  Covered?: Yes    Prescription Coverage Preferred [facility]: [facility], [facility], [facility], [facility]  Spoke with Person/Company/Phone Number:: [employer]  Co-Pay: $40 for 30 day retail  Prior Approval: No      [employer] Phone Number: 01/15/2020, 12:24 PM

## 2020-01-15 NOTE — Progress Notes (Signed)
Physical Therapy Treatment Patient Details Name: Mark Boyer MRN: [id number] DOB: Feb 24, 1961 Today's Date: 01/15/2020    History of Present Illness 59 year old male +COVID 01/01/20, admitted 01/12/20 with SOB. Patient with acute hypoxic respiratory failure and hypoxemia secondary to COVID PNA. Steroids and Remdesivir started 01/12/20. Patient also found to have PE and BLEs DVT-->started on Lovenox. PMH: DM2, HLD    PT Comments    Patient still presents with impaired balance. Near LOB to right during gait trial requiring minA from PT. Education provided to patient to have his sister or wife assist him with his mobility (as they were PTA) and for use of RW. Patient verbalized understanding. Continued recommendation for home PT, use of RW for all mobility, and 24/7 assist.   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 Restrictions Weight Bearing Restrictions: No    Mobility  Bed Mobility Overal bed mobility: Modified Independent General bed mobility comments: modI to independent bed mobility  Transfers Overall transfer level: Needs assistance Equipment used: None;Rolling walker (2 wheeled) Transfers: Sit to/from Stand Sit to Stand: Min guard;Supervision General transfer comment: close supervision/contact guard, patient reaches for environmental objects when he stands without AD  Ambulation/Gait Ambulation/Gait assistance: Min guard;Min assist Gait Distance (Feet): 100 Feet(10) Assistive device: Rolling walker (2 wheeled) Gait Pattern/deviations: Step-through pattern;Staggering right  General Gait Details: near LOB to right requiring minA from PT as patient unable to self correct using stepping response. Oxygen saturation 92% post  ambulation on room air.      Balance Overall balance assessment: Needs assistance Sitting-balance support: Feet supported Sitting balance-Leahy Scale: Good     Standing balance support: No upper extremity supported Standing balance-Leahy Scale: (Fair-) Standing balance comment: Patient unsteady in standing without AD    Cognition Arousal/Alertness: Awake/alert Behavior During Therapy: WFL for tasks assessed/performed Overall Cognitive Status: Within Functional Limits for tasks assessed       General Comments General comments (skin integrity, edema, etc.): Patient on room air. Oxygen saturation 92% post ambulation trial. HR in 90s bpm.      Pertinent Vitals/Pain Pain Assessment: No/denies pain(No complaints of pain, no signs/symptoms of pain.)           PT Goals (current goals can now be found in the care plan section) Progress towards PT goals: Progressing toward goals    Frequency    Min 3X/week      PT Plan Current plan remains appropriate       AM-PAC PT "6 Clicks" Mobility   Outcome Measure  Help needed turning from your back to your side while in a flat bed without using bedrails?: None Help needed moving from lying on your back to sitting on the side of a flat bed without using bedrails?: None Help needed moving to and from a bed to a chair (including a wheelchair)?: A Little Help needed standing up from a chair using your arms (e.g., wheelchair or bedside chair)?: A Little Help needed to walk in hospital room?: A Little Help needed climbing 3-5 steps with a railing? : A Little 6 Click Score: 20    End of Session   Activity Tolerance: Patient tolerated treatment well Patient left: in chair;with call bell/phone within reach(patient verbalized understanding to ring for assist) Nurse Communication: Mobility status PT  Visit Diagnosis: Unsteadiness on feet (R26.81);Other abnormalities of gait and mobility (R26.89);Muscle weakness (generalized) (M62.81)      Time: 1655-3748 PT Time Calculation (min) (ACUTE ONLY): 20 min  Charges:  $Gait Training: 8-22 mins                     Birdie Hopes, PT, DPT Acute Rehab [phone] office    Birdie Hopes 01/15/2020, 1:57 PM

## 2020-01-15 NOTE — TOC Transition Note (Signed)
Transition of Care [facility]) - CM/SW Discharge Note   Patient Details  Name: Mark Boyer MRN: [id number] Date of Birth: 1960/12/19  Transition of Care [facility]) CM/SW Contact:  Cherylann Parr, RN Phone Number: 01/15/2020, 12:22 PM   Clinical Narrative:    CM received verbal consult for covid at home program.  Pt in agreement - CM submitted referral via Epic, agency contacted and informed pt [place] a [place] address but within the mile max range.  [employer] confirms that home oxygen equipment and RW will be delivered to pts home within the hour.  Pt in agreement with HH as ordered.  Medicare.gov HH choice list given - pt did not hav ea  preference.  [facility] accept for [facility].  CM printed reduced copay for Xarelto card and printed it to nurse station - bedside nurse to give to pt before discharge.  Pt confirms that he will have 24 hour supervision at discharge.  Discharge order signed - no outstanding TOC needs determined - CM signing off    Final next level of care: Home w Home Health Services Barriers to Discharge: Barriers Resolved   Patient Goals and CMS Choice   CMS Medicare.gov Compare Post Acute Care list provided to:: Patient Choice offered to / list presented to : Patient  Discharge Placement                       Discharge Plan and Services     Post Acute Care Choice: Durable Medical Equipment          DME Arranged: Oxygen, Walker rolling([employer]) DME Agency: Other - Comment Date DME Agency Contacted: 01/14/20 Time DME Agency Contacted: 1000 Representative spoke with at DME Agency: Vaughan Basta HH Arranged: PT, OT HH Agency: [facility] Date [facility] Agency Contacted: 01/15/20 Time HH Agency Contacted: 1221 Representative spoke with at [facility] Agency: [facility]  Social Determinants of Health (SDOH) Interventions     Readmission Risk Interventions No flowsheet data found.

## 2020-01-15 NOTE — Progress Notes (Signed)
Patient scheduled for outpatient Remdesivir infusion at 10:00 AM on Saturday 5/8 and Sunday 5/9.  Please advise them to report to [facility] at [address]  Drive to the security guard and tell them you are here for an infusion. They will direct you to the front entrance where we will come and get you.  For questions call [phone].  Thanks

## 2020-01-15 NOTE — Plan of Care (Signed)
  Problem: Education: Goal: Knowledge of General Education information will improve Description: Including pain rating scale, medication(s)/side effects and non-pharmacologic comfort measures Outcome: Completed/Met   Problem: Health Behavior/Discharge Planning: Goal: Ability to manage health-related needs will improve Outcome: Completed/Met   Problem: Clinical Measurements: Goal: Ability to maintain clinical measurements within normal limits will improve Outcome: Completed/Met Goal: Will remain free from infection Outcome: Completed/Met Goal: Diagnostic test results will improve Outcome: Completed/Met Goal: Respiratory complications will improve Outcome: Completed/Met Goal: Cardiovascular complication will be avoided Outcome: Completed/Met   Problem: Activity: Goal: Risk for activity intolerance will decrease Outcome: Completed/Met   Problem: Nutrition: Goal: Adequate nutrition will be maintained Outcome: Completed/Met   Problem: Coping: Goal: Level of anxiety will decrease Outcome: Completed/Met   Problem: Elimination: Goal: Will not experience complications related to bowel motility Outcome: Completed/Met Goal: Will not experience complications related to urinary retention Outcome: Completed/Met   Problem: Pain Managment: Goal: General experience of comfort will improve Outcome: Completed/Met   Problem: Safety: Goal: Ability to remain free from injury will improve Outcome: Completed/Met   Problem: Skin Integrity: Goal: Risk for impaired skin integrity will decrease Outcome: Completed/Met   Problem: Education: Goal: Knowledge of risk factors and measures for prevention of condition will improve Outcome: Completed/Met   Problem: Respiratory: Goal: Will maintain a patent airway Outcome: Completed/Met Goal: Complications related to the disease process, condition or treatment will be avoided or minimized Outcome: Completed/Met

## 2020-01-15 NOTE — Plan of Care (Signed)
  Problem: Education: Goal: Knowledge of General Education information will improve Description: Including pain rating scale, medication(s)/side effects and non-pharmacologic comfort measures Outcome: Completed/Met   Problem: Health Behavior/Discharge Planning: Goal: Ability to manage health-related needs will improve Outcome: Completed/Met   Problem: Clinical Measurements: Goal: Ability to maintain clinical measurements within normal limits will improve Outcome: Completed/Met Goal: Will remain free from infection Outcome: Completed/Met Goal: Diagnostic test results will improve Outcome: Completed/Met Goal: Respiratory complications will improve Outcome: Completed/Met Goal: Cardiovascular complication will be avoided Outcome: Completed/Met   Problem: Activity: Goal: Risk for activity intolerance will decrease Outcome: Completed/Met   Problem: Nutrition: Goal: Adequate nutrition will be maintained Outcome: Completed/Met   Problem: Coping: Goal: Level of anxiety will decrease Outcome: Completed/Met   Problem: Elimination: Goal: Will not experience complications related to bowel motility Outcome: Completed/Met Goal: Will not experience complications related to urinary retention Outcome: Completed/Met   Problem: Pain Managment: Goal: General experience of comfort will improve Outcome: Completed/Met   Problem: Safety: Goal: Ability to remain free from injury will improve Outcome: Completed/Met   Problem: Skin Integrity: Goal: Risk for impaired skin integrity will decrease Outcome: Completed/Met   Problem: Education: Goal: Knowledge of risk factors and measures for prevention of condition will improve Outcome: Completed/Met   Problem: Respiratory: Goal: Will maintain a patent airway Outcome: Completed/Met Goal: Complications related to the disease process, condition or treatment will be avoided or minimized Outcome: Completed/Met   

## 2020-01-15 NOTE — Discharge Instructions (Addendum)
You are scheduled for an outpatient infusion of Remdesivir at 10:00 AM on Saturday 5/8 and Sunday 5/9.  [facility] at [address]  Drive to the security guard and tell them you are here for an infusion. They will direct you to the front entrance where we will come and get you.  For questions call [phone].  Thanks     Information on my medicine - XARELTO (rivaroxaban)  WHY WAS XARELTO PRESCRIBED FOR YOU? Xarelto was prescribed to treat blood clots that may have been found in the veins of your legs (deep vein thrombosis) or in your lungs (pulmonary embolism) and to reduce the risk of them occurring again.  What do you need to know about Xarelto? The starting dose is one 15 mg tablet taken TWICE daily with food for the FIRST 21 DAYS then on 02/04/2020  the dose is changed to one 20 mg tablet taken ONCE A DAY with your evening meal.  DO NOT stop taking Xarelto without talking to the health care provider who prescribed the medication.  Refill your prescription for 20 mg tablets before you run out.  After discharge, you should have regular check-up appointments with your healthcare provider that is prescribing your Xarelto.  In the future your dose may need to be changed if your kidney function changes by a significant amount.  What do you do if you miss a dose? If you are taking Xarelto TWICE DAILY and you miss a dose, take it as soon as you remember. You may take two 15 mg tablets (total 30 mg) at the same time then resume your regularly scheduled 15 mg twice daily the next day.  If you are taking Xarelto ONCE DAILY and you miss a dose, take it as soon as you remember on the same day then continue your regularly scheduled once daily regimen the next day. Do not take two doses of Xarelto at the same time.   Important Safety Information Xarelto is a blood thinner medicine that can cause bleeding. You should call your healthcare provider right away if  you experience any of the following: ? Bleeding from an injury or your nose that does not stop. ? Unusual colored urine (red or dark brown) or unusual colored stools (red or black). ? Unusual bruising for unknown reasons. ? A serious fall or if you hit your head (even if there is no bleeding).  Some medicines may interact with Xarelto and might increase your risk of bleeding while on Xarelto. To help avoid this, consult your healthcare provider or pharmacist prior to using any new prescription or non-prescription medications, including herbals, vitamins, non-steroidal anti-inflammatory drugs (NSAIDs) and supplements.  This website has more information on Xarelto: [email or web address]      Person Under Monitoring Name: Mark Boyer  Location: [place] [address]   Infection Prevention Recommendations for Individuals Confirmed to have, or Being Evaluated for, 2019 Novel Coronavirus (COVID-19) Infection Who Receive Care at Home  Individuals who are confirmed to have, or are being evaluated for, COVID-19 should follow the prevention steps below until a healthcare provider or local or state health department says they can return to normal activities.  Stay home except to get medical care You should restrict activities outside your home, except for getting medical care. Do not go to work, school, or public areas, and do not use public transportation or taxis.  Call ahead before visiting your doctor Before your medical appointment, call the  healthcare provider and tell them that you have, or are being evaluated for, COVID-19 infection. This will help the healthcare providers office take steps to keep other people from getting infected. Ask your healthcare provider to call the local or state health department.  Monitor your symptoms Seek prompt medical attention if your illness is worsening (e.g., difficulty breathing). Before going to your medical appointment, call the  healthcare provider and tell them that you have, or are being evaluated for, COVID-19 infection. Ask your healthcare provider to call the local or state health department.  Wear a facemask You should wear a facemask that covers your nose and mouth when you are in the same room with other people and when you visit a healthcare provider. People who live with or visit you should also wear a facemask while they are in the same room with you.  Separate yourself from other people in your home As much as possible, you should stay in a different room from other people in your home. Also, you should use a separate bathroom, if available.  Avoid sharing household items You should not share dishes, drinking glasses, cups, eating utensils, towels, bedding, or other items with other people in your home. After using these items, you should wash them thoroughly with soap and water.  Cover your coughs and sneezes Cover your mouth and nose with a tissue when you cough or sneeze, or you can cough or sneeze into your sleeve. Throw used tissues in a lined trash can, and immediately wash your hands with soap and water for at least 20 seconds or use an alcohol-based hand rub.  Wash your [employer] your hands often and thoroughly with soap and water for at least 20 seconds. You can use an alcohol-based hand sanitizer if soap and water are not available and if your hands are not visibly dirty. Avoid touching your eyes, nose, and mouth with unwashed hands.   Prevention Steps for Caregivers and Household Members of Individuals Confirmed to have, or Being Evaluated for, COVID-19 Infection Being Cared for in the Home  If you live with, or provide care at home for, a person confirmed to have, or being evaluated for, COVID-19 infection please follow these guidelines to prevent infection:  Follow healthcare providers instructions Make sure that you understand and can help the patient follow any healthcare  provider instructions for all care.  Provide for the patients basic needs You should help the patient with basic needs in the home and provide support for getting groceries, prescriptions, and other personal needs.  Monitor the patients symptoms If they are getting sicker, call his or her medical provider and tell them that the patient has, or is being evaluated for, COVID-19 infection. This will help the healthcare providers office take steps to keep other people from getting infected. Ask the healthcare provider to call the local or state health department.  Limit the number of people who have contact with the patient  If possible, have only one caregiver for the patient.  Other household members should stay in another home or place of residence. If this is not possible, they should stay  in another room, or be separated from the patient as much as possible. Use a separate bathroom, if available.  Restrict visitors who do not have an essential need to be in the home.  Keep older adults, very young children, and other sick people away from the patient Keep older adults, very young children, and those who have compromised immune  systems or chronic health conditions away from the patient. This includes people with chronic heart, lung, or kidney conditions, diabetes, and cancer.  Ensure good ventilation Make sure that shared spaces in the home have good air flow, such as from an air conditioner or an opened window, weather permitting.  Wash your hands often  Wash your hands often and thoroughly with soap and water for at least 20 seconds. You can use an alcohol based hand sanitizer if soap and water are not available and if your hands are not visibly dirty.  Avoid touching your eyes, nose, and mouth with unwashed hands.  Use disposable paper towels to dry your hands. If not available, use dedicated cloth towels and replace them when they become wet.  Wear a facemask and  gloves  Wear a disposable facemask at all times in the room and gloves when you touch or have contact with the patients blood, body fluids, and/or secretions or excretions, such as sweat, saliva, sputum, nasal mucus, vomit, urine, or feces.  Ensure the mask fits over your nose and mouth tightly, and do not touch it during use.  Throw out disposable facemasks and gloves after using them. Do not reuse.  Wash your hands immediately after removing your facemask and gloves.  If your personal clothing becomes contaminated, carefully remove clothing and launder. Wash your hands after handling contaminated clothing.  Place all used disposable facemasks, gloves, and other waste in a lined container before disposing them with other household waste.  Remove gloves and wash your hands immediately after handling these items.  Do not share dishes, glasses, or other household items with the patient  Avoid sharing household items. You should not share dishes, drinking glasses, cups, eating utensils, towels, bedding, or other items with a patient who is confirmed to have, or being evaluated for, COVID-19 infection.  After the person uses these items, you should wash them thoroughly with soap and water.  Wash laundry thoroughly  Immediately remove and wash clothes or bedding that have blood, body fluids, and/or secretions or excretions, such as sweat, saliva, sputum, nasal mucus, vomit, urine, or feces, on them.  Wear gloves when handling laundry from the patient.  Read and follow directions on labels of laundry or clothing items and detergent. In general, wash and dry with the warmest temperatures recommended on the label.  Clean all areas the individual has used often  Clean all touchable surfaces, such as counters, tabletops, doorknobs, bathroom fixtures, toilets, phones, keyboards, tablets, and bedside tables, every day. Also, clean any surfaces that may have blood, body fluids, and/or secretions or  excretions on them.  Wear gloves when cleaning surfaces the patient has come in contact with.  Use a diluted bleach solution (e.g., dilute bleach with 1 part bleach and 10 parts water) or a household disinfectant with a label that says EPA-registered for coronaviruses. To make a bleach solution at home, add 1 tablespoon of bleach to 1 quart (4 cups) of water. For a larger supply, add  cup of bleach to 1 gallon (16 cups) of water.  Read labels of cleaning products and follow recommendations provided on product labels. Labels contain instructions for safe and effective use of the cleaning product including precautions you should take when applying the product, such as wearing gloves or eye protection and making sure you have good ventilation during use of the product.  Remove gloves and wash hands immediately after cleaning.  Monitor yourself for signs and symptoms of illness Caregivers and household  members are considered close contacts, should monitor their health, and will be asked to limit movement outside of the home to the extent possible. Follow the monitoring steps for close contacts listed on the symptom monitoring form.   ? If you have additional questions, contact your local health department or call the epidemiologist on call at [phone] (available 24/7). ? This guidance is subject to change. For the most up-to-date guidance from [facility], please refer to their website: [email or web address].hu

## 2020-01-15 NOTE — Discharge Summary (Signed)
PATIENT DETAILS Name: Mark Boyer Age: 59 y.o. Sex: male Date of Birth: [birth year] MRN: [id number]. Admitting Physician: Eduard Clos, MD WUJ:[id number], [facility]  Admit Date: 01/12/2020 Discharge date: 01/15/2020  Recommendations for Outpatient Follow-up:  1. Follow up with PCP in 1-2 weeks 2. Please obtain CMP/CBC in one week 3. Repeat Chest Xray in 4-6 week 4. Please reassess if patient still requires home O2 at next visit.  Admitted From:  Home  Disposition: Home with home health services   Home Health:  Yes  Equipment/Devices: Oxygen 2L  Discharge Condition: Stable  CODE STATUS: FULL CODE  Diet recommendation:  Diet Order            Diet - low sodium heart healthy        Diet Carb Modified        Diet heart healthy/carb modified Room service appropriate? Yes; Fluid consistency: Thin  Diet effective now                Brief Narrative: Patient is a 59 y.o. male with PMHx of insulin-dependent DM-2, HLD-who was positive for COVID-19 on 4/23-presented with shortness of breath-found to have acute hypoxic respiratory failure and subsequently admitted to the hospitalist service.  See below for further details.  Significant Events: 5/4>> admit to [facility] for hypoxemia secondary to COVID-19 pneumonia  COVID-19 medications: Steroids: 5/4>> Remdesivir: 5/4>>  Antibiotics: None  Microbiology data: 5/4: Blood cultures>> negative   Brief Hospital Course: Acute Hypoxic Resp Failure due to Covid 19 Viral pneumonia and pulmonary embolism:  Significantly better-has been titrated to room air since yesterday.  But requires around 2 L of oxygen with ambulation.  Feels much better-and is anxious to go home today.  He will continue with steroids-and complete remdesivir infusion in the outpatient setting at the infusion center at [facility].  He will continue anticoagulation on discharge-suspect he had a provoked VTE in the setting of  COVID-19.    COVID-19 Labs:  Recent Labs    01/12/20 2010 01/12/20 2010 01/13/20 0644 01/14/20 0240 01/15/20 0556  DDIMER >20.00*   < > >20.00* 11.48* 7.88*  FERRITIN 341*   < > 327 320 335  LDH 441*  --   --   --   --   CRP 11.5*   < > 9.2* 6.8* 3.1*   < > = values in this interval not displayed.    Lab Results  Component Value Date   SARSCOV2NAA POSITIVE (A) 01/12/2020     Pulmonary embolism with bilateral lower extremity DVT: Started on Lovenox-has been transitioned to Xarelto.  AKI: Resolved-likely hemodynamically mediated-given resolution of AKI-do not think patient had CKD stage III.  HLD: Continue statin  Insulin-dependent DM-2 with uncontrolled hyperglycemia secondary to steroids:  CBGs relatively stable-resume usual regimen of insulin/hypoglycemic agents on discharge.  Follow-up with PCP for further optimization.  Morbid Obesity: Estimated body mass index is 33.68 kg/m as calculated from the following:   Height as of this encounter: 5\' 10"  (1.778 m).   Weight as of this encounter: 106.5 kg.   Discharge Diagnoses:  Principal Problem:   Acute respiratory failure due to COVID-19 [facility]) Active Problems:   Acute respiratory failure with hypoxia (HCC)   Pneumonia due to COVID-19 virus   Uncontrolled type 2 diabetes mellitus with hyperglycemia [facility])   Discharge Instructions:    Person Under Monitoring Name: Ingvald Theisen  Location: [address] [place] [address]   Infection Prevention Recommendations for Individuals Confirmed to have,  or Being Evaluated for, 2019 Novel Coronavirus (COVID-19) Infection Who Receive Care at Home  Individuals who are confirmed to have, or are being evaluated for, COVID-19 should follow the prevention steps below until a healthcare provider or local or state health department says they can return to normal activities.  Stay home except to get medical care You should restrict activities outside your home, except for  getting medical care. Do not go to work, school, or public areas, and do not use public transportation or taxis.  Call ahead before visiting your doctor Before your medical appointment, call the healthcare provider and tell them that you have, or are being evaluated for, COVID-19 infection. This will help the healthcare provider's office take steps to keep other people from getting infected. Ask your healthcare provider to call the local or state health department.  Monitor your symptoms Seek prompt medical attention if your illness is worsening (e.g., difficulty breathing). Before going to your medical appointment, call the healthcare provider and tell them that you have, or are being evaluated for, COVID-19 infection. Ask your healthcare provider to call the local or state health department.  Wear a facemask You should wear a facemask that covers your nose and mouth when you are in the same room with other people and when you visit a healthcare provider. People who live with or visit you should also wear a facemask while they are in the same room with you.  Separate yourself from other people in your home As much as possible, you should stay in a different room from other people in your home. Also, you should use a separate bathroom, if available.  Avoid sharing household items You should not share dishes, drinking glasses, cups, eating utensils, towels, bedding, or other items with other people in your home. After using these items, you should wash them thoroughly with soap and water.  Cover your coughs and sneezes Cover your mouth and nose with a tissue when you cough or sneeze, or you can cough or sneeze into your sleeve. Throw used tissues in a lined trash can, and immediately wash your hands with soap and water for at least 20 seconds or use an alcohol-based hand rub.  Wash your [employer] your hands often and thoroughly with soap and water for at least 20 seconds. You can use  an alcohol-based hand sanitizer if soap and water are not available and if your hands are not visibly dirty. Avoid touching your eyes, nose, and mouth with unwashed hands.   Prevention Steps for Caregivers and Household Members of Individuals Confirmed to have, or Being Evaluated for, COVID-19 Infection Being Cared for in the Home  If you live with, or provide care at home for, a person confirmed to have, or being evaluated for, COVID-19 infection please follow these guidelines to prevent infection:  Follow healthcare provider's instructions Make sure that you understand and can help the patient follow any healthcare provider instructions for all care.  Provide for the patient's basic needs You should help the patient with basic needs in the home and provide support for getting groceries, prescriptions, and other personal needs.  Monitor the patient's symptoms If they are getting sicker, call his or her medical provider and tell them that the patient has, or is being evaluated for, COVID-19 infection. This will help the healthcare provider's office take steps to keep other people from getting infected. Ask the healthcare provider to call the local or state health department.  Limit the number of  people who have contact with the patient  If possible, have only one caregiver for the patient.  Other household members should stay in another home or place of residence. If this is not possible, they should stay  in another room, or be separated from the patient as much as possible. Use a separate bathroom, if available.  Restrict visitors who do not have an essential need to be in the home.  Keep older adults, very young children, and other sick people away from the patient Keep older adults, very young children, and those who have compromised immune systems or chronic health conditions away from the patient. This includes people with chronic heart, lung, or kidney conditions, diabetes,  and cancer.  Ensure good ventilation Make sure that shared spaces in the home have good air flow, such as from an air conditioner or an opened window, weather permitting.  Wash your hands often  Wash your hands often and thoroughly with soap and water for at least 20 seconds. You can use an alcohol based hand sanitizer if soap and water are not available and if your hands are not visibly dirty.  Avoid touching your eyes, nose, and mouth with unwashed hands.  Use disposable paper towels to dry your hands. If not available, use dedicated cloth towels and replace them when they become wet.  Wear a facemask and gloves  Wear a disposable facemask at all times in the room and gloves when you touch or have contact with the patient's blood, body fluids, and/or secretions or excretions, such as sweat, saliva, sputum, nasal mucus, vomit, urine, or feces.  Ensure the mask fits over your nose and mouth tightly, and do not touch it during use.  Throw out disposable facemasks and gloves after using them. Do not reuse.  Wash your hands immediately after removing your facemask and gloves.  If your personal clothing becomes contaminated, carefully remove clothing and launder. Wash your hands after handling contaminated clothing.  Place all used disposable facemasks, gloves, and other waste in a lined container before disposing them with other household waste.  Remove gloves and wash your hands immediately after handling these items.  Do not share dishes, glasses, or other household items with the patient  Avoid sharing household items. You should not share dishes, drinking glasses, cups, eating utensils, towels, bedding, or other items with a patient who is confirmed to have, or being evaluated for, COVID-19 infection.  After the person uses these items, you should wash them thoroughly with soap and water.  Wash laundry thoroughly  Immediately remove and wash clothes or bedding that have blood,  body fluids, and/or secretions or excretions, such as sweat, saliva, sputum, nasal mucus, vomit, urine, or feces, on them.  Wear gloves when handling laundry from the patient.  Read and follow directions on labels of laundry or clothing items and detergent. In general, wash and dry with the warmest temperatures recommended on the label.  Clean all areas the individual has used often  Clean all touchable surfaces, such as counters, tabletops, doorknobs, bathroom fixtures, toilets, phones, keyboards, tablets, and bedside tables, every day. Also, clean any surfaces that may have blood, body fluids, and/or secretions or excretions on them.  Wear gloves when cleaning surfaces the patient has come in contact with.  Use a diluted bleach solution (e.g., dilute bleach with 1 part bleach and 10 parts water) or a household disinfectant with a label that says EPA-registered for coronaviruses. To make a bleach solution at home, add 1  tablespoon of bleach to 1 quart (4 cups) of water. For a larger supply, add  cup of bleach to 1 gallon (16 cups) of water.  Read labels of cleaning products and follow recommendations provided on product labels. Labels contain instructions for safe and effective use of the cleaning product including precautions you should take when applying the product, such as wearing gloves or eye protection and making sure you have good ventilation during use of the product.  Remove gloves and wash hands immediately after cleaning.  Monitor yourself for signs and symptoms of illness Caregivers and household members are considered close contacts, should monitor their health, and will be asked to limit movement outside of the home to the extent possible. Follow the monitoring steps for close contacts listed on the symptom monitoring form.   ? If you have additional questions, contact your local health department or call the epidemiologist on call at [phone] (available 24/7). ? This  guidance is subject to change. For the most up-to-date guidance from CDC, please refer to their website: TripMetro.hu    Activity:  As tolerated  Discharge Instructions    Call MD for:  difficulty breathing, headache or visual disturbances   Complete by: As directed    Call MD for:  persistant dizziness or light-headedness   Complete by: As directed    Diet - low sodium heart healthy   Complete by: As directed    Diet Carb Modified   Complete by: As directed    Discharge instructions   Complete by: As directed    1.)  3 weeks of isolation from 4/23  2.)  Please ask your primary care practitioner at next visit whether you still need to be on oxygen.  3.)  You will need at least 6 months of anticoagulation/blood thinning agent-please ask your primary care practitioner for further refills at your next visit.  4.)  Please ask your primary care practitioner to consider referring you to a hematologist/blood doctor before discontinuation of anticoagulation.   Follow with Primary MD  Practice, [facility] in 1-2 weeks  Please get a complete blood count and chemistry panel checked by your Primary MD at your next visit, and again as instructed by your Primary MD.  Get Medicines reviewed and adjusted: Please take all your medications with you for your next visit with your Primary MD  Laboratory/radiological data: Please request your Primary MD to go over all hospital tests and procedure/radiological results at the follow up, please ask your Primary MD to get all Hospital records sent to his/her office.  In some cases, they will be blood work, cultures and biopsy results pending at the time of your discharge. Please request that your primary care M.D. follows up on these results.  Also Note the following: If you experience worsening of your admission symptoms, develop shortness of breath, life threatening emergency,  suicidal or homicidal thoughts you must seek medical attention immediately by calling 911 or calling your MD immediately  if symptoms less severe.  You must read complete instructions/literature along with all the possible adverse reactions/side effects for all the Medicines you take and that have been prescribed to you. Take any new Medicines after you have completely understood and accpet all the possible adverse reactions/side effects.   Do not drive when taking Pain medications or sleeping medications (Benzodaizepines)  Do not take more than prescribed Pain, Sleep and Anxiety Medications. It is not advisable to combine anxiety,sleep and pain medications without talking with your primary  care practitioner  Special Instructions: If you have smoked or chewed Tobacco  in the last 2 yrs please stop smoking, stop any regular Alcohol  and or any Recreational drug use.  Wear Seat belts while driving.  Please note: You were cared for by a hospitalist during your hospital stay. Once you are discharged, your primary care physician will handle any further medical issues. Please note that NO REFILLS for any discharge medications will be authorized once you are discharged, as it is imperative that you return to your primary care physician (or establish a relationship with a primary care physician if you do not have one) for your post hospital discharge needs so that they can reassess your need for medications and monitor your lab values.   Increase activity slowly   Complete by: As directed      Allergies as of 01/15/2020   No Known Allergies     Medication List    STOP taking these medications   aspirin EC 81 MG tablet     TAKE these medications   acetaminophen 325 MG tablet Commonly known as: TYLENOL Take 650 mg by mouth every 6 (six) hours as needed for mild pain.   atorvastatin 20 MG tablet Commonly known as: LIPITOR Take 20 mg by mouth at bedtime.   benzonatate 200 MG capsule Commonly  known as: TESSALON Take 1 capsule (200 mg total) by mouth 3 (three) times daily as needed for cough.   dexamethasone 6 MG tablet Commonly known as: DECADRON Take 1 tablet (6 mg total) by mouth daily.   FreeStyle Libre 14 Day Sensor Misc Inject 1 patch into the skin every 14 (fourteen) days.   Januvia 100 MG tablet Generic drug: sitaGLIPtin Take 100 mg by mouth daily.   Levemir FlexTouch 100 UNIT/ML FlexPen Generic drug: insulin detemir Inject 80 Units into the skin See admin instructions. Inject 80 units into the skin at bedtime, increasing by 1 unit a day until fasting glucose is 90-110   levocetirizine 5 MG tablet Commonly known as: XYZAL Take 5 mg by mouth at bedtime.   metFORMIN 500 MG 24 hr tablet Commonly known as: GLUCOPHAGE-XR Take 2,000 mg by mouth in the morning.   NovoLOG FlexPen 100 UNIT/ML FlexPen Generic drug: insulin aspart Inject 5-25 Units into the skin See admin instructions. Inject 24-25 units into the skin before breakfast, 5 units before lunch, and 25 units before supper (evening meal)   Rivaroxaban Stater Pack (15 mg and 20 mg) Commonly known as: XARELTO STARTER PACK Follow package directions: Take one  tablet by mouth twice a day. On day 22, switch to one  tablet once a day. Take with food.   VITAMIN D PO Take 1 capsule by mouth at bedtime.            Durable Medical Equipment  (From admission, onward)         Start     Ordered   01/14/20 1603  For home use only DME Walker rolling  Once    Question Answer Comment  Walker: With 5 Inch Wheels   Patient needs a walker to treat with the following condition Physical deconditioning      01/14/20 1603   01/14/20 1035  For home use only DME oxygen  Once    Question Answer Comment  Length of Need 6 Months   Mode or (Route) Nasal cannula   Liters per Minute 2   Frequency Continuous (stationary and portable oxygen unit needed)   Oxygen  conserving device Yes   Oxygen delivery system Gas       01/14/20 1034         Follow-up Information    [facility] Follow up.   Why: home oxygen       Practice, [facility]. Schedule an appointment as soon as possible for a visit in 1 week(s).   Contact information: [address] [place] [id number] [phone]          No Known Allergies   Other Procedures/Studies: CT ANGIO CHEST PE W OR WO CONTRAST  Addendum Date: 01/13/2020   ADDENDUM REPORT: 01/13/2020 13:10 ADDENDUM: Critical Value/emergent results were called by telephone at the time of interpretation on 01/13/2020 at 1:o0 pm to provider Dr. Jerral RalphGhimire, who verbally acknowledged these results. Electronically Signed   By: Lupita RaiderJames  Green Jr M.D.   On: 01/13/2020 13:10   Result Date: 01/13/2020 CLINICAL DATA:  Shortness of breath. EXAM: CT ANGIOGRAPHY CHEST WITH CONTRAST TECHNIQUE: Multidetector CT imaging of the chest was performed using the standard protocol during bolus administration of intravenous contrast. Multiplanar CT image reconstructions and MIPs were obtained to evaluate the vascular anatomy. CONTRAST:  50mL OMNIPAQUE IOHEXOL 350 MG/ML SOLN COMPARISON:  None. FINDINGS: Cardiovascular: Filling defect is noted in lower lobe branch of right pulmonary artery consistent with pulmonary embolus. Normal cardiac size. No pericardial effusion. Coronary artery calcifications are noted. Atherosclerosis of thoracic aorta is noted without aneurysm formation. Mediastinum/Nodes: No enlarged mediastinal, hilar, or axillary lymph nodes. Thyroid gland, trachea, and esophagus demonstrate no significant findings. Lungs/Pleura: No pneumothorax or pleural effusion is noted. Multiple airspace opacities are noted throughout both lungs consistent with multifocal pneumonia, potentially of viral etiology. Upper Abdomen: No acute abnormality. Musculoskeletal: No chest wall abnormality. No acute or significant osseous findings. Review of the MIP images confirms the above findings.  IMPRESSION: 1. Filling defect is noted in lower lobe branch of right pulmonary artery consistent with pulmonary embolus. 2. Coronary artery calcifications are noted suggesting coronary artery disease. 3. Multiple airspace opacities are noted throughout both lungs consistent with multifocal pneumonia, potentially of viral etiology. Aortic Atherosclerosis (ICD10-I70.0). Electronically Signed: By: Lupita RaiderJames  Green Jr M.D. On: 01/13/2020 12:43   DG Chest Port 1 View  Result Date: 01/12/2020 CLINICAL DATA:  Short of breath, COVID-19 positive EXAM: PORTABLE CHEST 1 VIEW COMPARISON:  None. FINDINGS: Single frontal view of the chest demonstrates an unremarkable cardiac silhouette. There is patchy bilateral airspace disease greatest at the left lung base. No effusion or pneumothorax. No acute bony abnormalities. IMPRESSION: 1. Bilateral airspace disease greatest at the left lung base, consistent with multifocal pneumonia. This is certainly compatible with history of COVID-19. Electronically Signed   By: Sharlet SalinaMichael  Brown M.D.   On: 01/12/2020 20:59   ECHOCARDIOGRAM COMPLETE  Result Date: 01/14/2020    ECHOCARDIOGRAM REPORT   Patient Name:   Ortencia KickBYRON Dace Date of Exam: 01/14/2020 Medical Rec #:  [id number]    Height:       70.0 in Accession #:    [id number]   Weight:       234.7 lb Date of Birth:  10/20/1960     BSA:          2.234 m Patient Age:    59 years     BP:           121/66 mmHg Patient Gender: M            HR:  89 bpm. Exam Location:  Inpatient Procedure: 2D Echo Indications:    Pulmonary Embolus 415.19 / I26.99  History:        Patient has no prior history of Echocardiogram examinations.                 Risk Factors:Diabetes. Acute respiratory failure with hypoxia.  Sonographer:    Leeroy Bock Turrentine Referring Phys: [id number] Dewayne Shorter M Melvern Ramone  Sonographer Comments: Covid 19 positive IMPRESSIONS  1. Left ventricular ejection fraction, by estimation, is 60 to 65%. The left ventricle has normal function. The left  ventricle has no regional wall motion abnormalities. Left ventricular diastolic parameters were normal.  2. Right ventricular systolic function is normal. The right ventricular size is normal.  3. The mitral valve is abnormal. Trivial mitral valve regurgitation.  4. The aortic valve is tricuspid. Aortic valve regurgitation is not visualized. Mild aortic valve sclerosis is present, with no evidence of aortic valve stenosis. FINDINGS  Left Ventricle: Left ventricular ejection fraction, by estimation, is 60 to 65%. The left ventricle has normal function. The left ventricle has no regional wall motion abnormalities. The left ventricular internal cavity size was normal in size. There is  no left ventricular hypertrophy. Left ventricular diastolic parameters were normal. Right Ventricle: The right ventricular size is normal. No increase in right ventricular wall thickness. Right ventricular systolic function is normal. Left Atrium: Left atrial size was normal in size. Right Atrium: Right atrial size was normal in size. Pericardium: There is no evidence of pericardial effusion. Mitral Valve: The mitral valve is abnormal. There is mild thickening of the mitral valve leaflet(s). Mild mitral annular calcification. Trivial mitral valve regurgitation. Tricuspid Valve: The tricuspid valve is normal in structure. Tricuspid valve regurgitation is trivial. Aortic Valve: The aortic valve is tricuspid. Aortic valve regurgitation is not visualized. Mild aortic valve sclerosis is present, with no evidence of aortic valve stenosis. Pulmonic Valve: The pulmonic valve was normal in structure. Pulmonic valve regurgitation is trivial. Aorta: The aortic root is normal in size and structure. IAS/Shunts: No atrial level shunt detected by color flow Doppler.  LEFT VENTRICLE PLAX 2D LVIDd:         4.00 cm  Diastology LVIDs:         2.80 cm  LV e' lateral:   8.27 cm/s LV PW:         1.10 cm  LV E/e' lateral: 10.3 LV IVS:        1.10 cm  LV e'  medial:    8.05 cm/s LVOT diam:     1.70 cm  LV E/e' medial:  10.6 LV SV:         55 LV SV Index:   25 LVOT Area:     2.27 cm  RIGHT VENTRICLE RV S prime:     14.50 cm/s TAPSE (M-mode): 2.3 cm LEFT ATRIUM             Index       RIGHT ATRIUM           Index LA diam:        4.00 cm 1.79 cm/m  RA Area:     14.80 cm LA Vol (A2C):   44.9 ml 20.10 ml/m RA Volume:   34.90 ml  15.62 ml/m LA Vol (A4C):   48.1 ml 21.53 ml/m LA Biplane Vol: 46.3 ml 20.72 ml/m  AORTIC VALVE LVOT Vmax:   119.00 cm/s LVOT Vmean:  85.800 cm/s LVOT VTI:    0.244  m  AORTA Ao Root diam: 2.80 cm MITRAL VALVE MV Area (PHT): 3.85 cm    SHUNTS MV Decel Time: 197 msec    Systemic VTI:  0.24 m MV E velocity: 85.30 cm/s  Systemic Diam: 1.70 cm MV A velocity: 88.30 cm/s MV E/A ratio:  0.97 Dietrich Pates MD Electronically signed by Dietrich Pates MD Signature Date/Time: 01/14/2020/6:12:55 PM    Final    VAS [place] LOWER EXTREMITY VENOUS (DVT)  Result Date: 01/13/2020  Lower Venous DVTStudy Indications: D dimer.  Comparison Study: no prior Performing Technologist: Jeb Levering RDMS, RVT  Examination Guidelines: A complete evaluation includes B-mode imaging, spectral Doppler, color Doppler, and power Doppler as needed of all accessible portions of each vessel. Bilateral testing is considered an integral part of a complete examination. Limited examinations for reoccurring indications may be performed as noted. The reflux portion of the exam is performed with the patient in reverse Trendelenburg.  +---------+---------------+---------+-----------+----------+--------------+ RIGHT    CompressibilityPhasicitySpontaneityPropertiesThrombus Aging +---------+---------------+---------+-----------+----------+--------------+ CFV                                                   Not visualized +---------+---------------+---------+-----------+----------+--------------+ FV Prox  Full           Yes      Yes                                  +---------+---------------+---------+-----------+----------+--------------+ FV Mid   Full                                                        +---------+---------------+---------+-----------+----------+--------------+ FV DistalFull                                                        +---------+---------------+---------+-----------+----------+--------------+ PFV      Full                                                        +---------+---------------+---------+-----------+----------+--------------+ POP      Full           Yes      Yes                                 +---------+---------------+---------+-----------+----------+--------------+ PTV      None                                         Acute          +---------+---------------+---------+-----------+----------+--------------+ PERO     Full                                                        +---------+---------------+---------+-----------+----------+--------------+  Soleal   None                                         Acute          +---------+---------------+---------+-----------+----------+--------------+   +---------+---------------+---------+-----------+----------+--------------+ LEFT     CompressibilityPhasicitySpontaneityPropertiesThrombus Aging +---------+---------------+---------+-----------+----------+--------------+ CFV                                                   Not visualized +---------+---------------+---------+-----------+----------+--------------+ FV Prox  Full           Yes      Yes                                 +---------+---------------+---------+-----------+----------+--------------+ FV Mid   Full                                                        +---------+---------------+---------+-----------+----------+--------------+ FV DistalFull                                                         +---------+---------------+---------+-----------+----------+--------------+ PFV      Full                                                        +---------+---------------+---------+-----------+----------+--------------+ POP      Full           Yes      Yes                                 +---------+---------------+---------+-----------+----------+--------------+ PTV      Full                                                        +---------+---------------+---------+-----------+----------+--------------+ PERO     Full                                                        +---------+---------------+---------+-----------+----------+--------------+ Soleal   None                                         Acute          +---------+---------------+---------+-----------+----------+--------------+     Summary: RIGHT: -  Findings consistent with acute deep vein thrombosis involving the right posterior tibial veins, and right soleal veins. - No cystic structure found in the popliteal fossa.  LEFT: - Findings consistent with acute deep vein thrombosis involving the left soleal veins. - No cystic structure found in the popliteal fossa.  *See table(s) above for measurements and observations. Electronically signed by Coral Else MD on 01/13/2020 at 8:39:37 PM.    Final      TODAY-DAY OF DISCHARGE:  Subjective:   Trinna Balloon today has no headache,no chest abdominal pain,no new weakness tingling or numbness, feels much better wants to go home today.  Objective:   Blood pressure 132/87, pulse 80, temperature 98 F (36.7 C), temperature source Oral, resp. rate 18, height 5\' 10"  (1.778 m), weight 106.5 kg, SpO2 95 %.  Intake/Output Summary (Last 24 hours) at 01/15/2020 1148 Last data filed at 01/14/2020 1500 Gross per 24 hour  Intake 120 ml  Output --  Net 120 ml   Filed Weights   01/12/20 1748 01/13/20 0215  Weight: 108.9 kg 106.5 kg    Exam: Awake Alert, Oriented *3, No new  F.N deficits, Normal affect [place].AT,PERRAL Supple Neck,No JVD, No cervical lymphadenopathy appriciated.  Symmetrical Chest wall movement, Good air movement bilaterally, CTAB RRR,No Gallops,Rubs or new Murmurs, No Parasternal Heave +ve B.Sounds, Abd Soft, Non tender, No organomegaly appriciated, No rebound -guarding or rigidity. No Cyanosis, Clubbing or edema, No new Rash or bruise   PERTINENT RADIOLOGIC STUDIES: CT ANGIO CHEST PE W OR WO CONTRAST  Addendum Date: 01/13/2020   ADDENDUM REPORT: 01/13/2020 13:10 ADDENDUM: Critical Value/emergent results were called by telephone at the time of interpretation on 01/13/2020 at 1:o0 pm to provider Dr. Jerral Ralph, who verbally acknowledged these results. Electronically Signed   By: Lupita Raider M.D.   On: 01/13/2020 13:10   Result Date: 01/13/2020 CLINICAL DATA:  Shortness of breath. EXAM: CT ANGIOGRAPHY CHEST WITH CONTRAST TECHNIQUE: Multidetector CT imaging of the chest was performed using the standard protocol during bolus administration of intravenous contrast. Multiplanar CT image reconstructions and MIPs were obtained to evaluate the vascular anatomy. CONTRAST:  50mL OMNIPAQUE IOHEXOL 350 MG/ML SOLN COMPARISON:  None. FINDINGS: Cardiovascular: Filling defect is noted in lower lobe branch of right pulmonary artery consistent with pulmonary embolus. Normal cardiac size. No pericardial effusion. Coronary artery calcifications are noted. Atherosclerosis of thoracic aorta is noted without aneurysm formation. Mediastinum/Nodes: No enlarged mediastinal, hilar, or axillary lymph nodes. Thyroid gland, trachea, and esophagus demonstrate no significant findings. Lungs/Pleura: No pneumothorax or pleural effusion is noted. Multiple airspace opacities are noted throughout both lungs consistent with multifocal pneumonia, potentially of viral etiology. Upper Abdomen: No acute abnormality. Musculoskeletal: No chest wall abnormality. No acute or significant osseous findings.  Review of the MIP images confirms the above findings. IMPRESSION: 1. Filling defect is noted in lower lobe branch of right pulmonary artery consistent with pulmonary embolus. 2. Coronary artery calcifications are noted suggesting coronary artery disease. 3. Multiple airspace opacities are noted throughout both lungs consistent with multifocal pneumonia, potentially of viral etiology. Aortic Atherosclerosis (ICD10-I70.0). Electronically Signed: By: Lupita Raider M.D. On: 01/13/2020 12:43   DG Chest Port 1 View  Result Date: 01/12/2020 CLINICAL DATA:  Short of breath, COVID-19 positive EXAM: PORTABLE CHEST 1 VIEW COMPARISON:  None. FINDINGS: Single frontal view of the chest demonstrates an unremarkable cardiac silhouette. There is patchy bilateral airspace disease greatest at the left lung base. No effusion or pneumothorax. No acute bony abnormalities. IMPRESSION: 1.  Bilateral airspace disease greatest at the left lung base, consistent with multifocal pneumonia. This is certainly compatible with history of COVID-19. Electronically Signed   By: Sharlet Salina M.D.   On: 01/12/2020 20:59   ECHOCARDIOGRAM COMPLETE  Result Date: 01/14/2020    ECHOCARDIOGRAM REPORT   Patient Name:   Mark Boyer Date of Exam: 01/14/2020 Medical Rec #:  [id number]    Height:       70.0 in Accession #:    [id number]   Weight:       234.7 lb Date of Birth:  11/13/1960     BSA:          2.234 m Patient Age:    59 years     BP:           121/66 mmHg Patient Gender: M            HR:           89 bpm. Exam Location:  Inpatient Procedure: 2D Echo Indications:    Pulmonary Embolus 415.19 / I26.99  History:        Patient has no prior history of Echocardiogram examinations.                 Risk Factors:Diabetes. Acute respiratory failure with hypoxia.  Sonographer:    Leeroy Bock Turrentine Referring Phys: [id number] Dewayne Shorter M Rees Matura  Sonographer Comments: Covid 19 positive IMPRESSIONS  1. Left ventricular ejection fraction, by estimation, is 60 to 65%.  The left ventricle has normal function. The left ventricle has no regional wall motion abnormalities. Left ventricular diastolic parameters were normal.  2. Right ventricular systolic function is normal. The right ventricular size is normal.  3. The mitral valve is abnormal. Trivial mitral valve regurgitation.  4. The aortic valve is tricuspid. Aortic valve regurgitation is not visualized. Mild aortic valve sclerosis is present, with no evidence of aortic valve stenosis. FINDINGS  Left Ventricle: Left ventricular ejection fraction, by estimation, is 60 to 65%. The left ventricle has normal function. The left ventricle has no regional wall motion abnormalities. The left ventricular internal cavity size was normal in size. There is  no left ventricular hypertrophy. Left ventricular diastolic parameters were normal. Right Ventricle: The right ventricular size is normal. No increase in right ventricular wall thickness. Right ventricular systolic function is normal. Left Atrium: Left atrial size was normal in size. Right Atrium: Right atrial size was normal in size. Pericardium: There is no evidence of pericardial effusion. Mitral Valve: The mitral valve is abnormal. There is mild thickening of the mitral valve leaflet(s). Mild mitral annular calcification. Trivial mitral valve regurgitation. Tricuspid Valve: The tricuspid valve is normal in structure. Tricuspid valve regurgitation is trivial. Aortic Valve: The aortic valve is tricuspid. Aortic valve regurgitation is not visualized. Mild aortic valve sclerosis is present, with no evidence of aortic valve stenosis. Pulmonic Valve: The pulmonic valve was normal in structure. Pulmonic valve regurgitation is trivial. Aorta: The aortic root is normal in size and structure. IAS/Shunts: No atrial level shunt detected by color flow Doppler.  LEFT VENTRICLE PLAX 2D LVIDd:         4.00 cm  Diastology LVIDs:         2.80 cm  LV e' lateral:   8.27 cm/s LV PW:         1.10 cm  LV  E/e' lateral: 10.3 LV IVS:        1.10 cm  LV e' medial:    8.05 cm/s LVOT  diam:     1.70 cm  LV E/e' medial:  10.6 LV SV:         55 LV SV Index:   25 LVOT Area:     2.27 cm  RIGHT VENTRICLE RV S prime:     14.50 cm/s TAPSE (M-mode): 2.3 cm LEFT ATRIUM             Index       RIGHT ATRIUM           Index LA diam:        4.00 cm 1.79 cm/m  RA Area:     14.80 cm LA Vol (A2C):   44.9 ml 20.10 ml/m RA Volume:   34.90 ml  15.62 ml/m LA Vol (A4C):   48.1 ml 21.53 ml/m LA Biplane Vol: 46.3 ml 20.72 ml/m  AORTIC VALVE LVOT Vmax:   119.00 cm/s LVOT Vmean:  85.800 cm/s LVOT VTI:    0.244 m  AORTA Ao Root diam: 2.80 cm MITRAL VALVE MV Area (PHT): 3.85 cm    SHUNTS MV Decel Time: 197 msec    Systemic VTI:  0.24 m MV E velocity: 85.30 cm/s  Systemic Diam: 1.70 cm MV A velocity: 88.30 cm/s MV E/A ratio:  0.97 Dietrich Pates MD Electronically signed by Dietrich Pates MD Signature Date/Time: 01/14/2020/6:12:55 PM    Final    VAS [place] LOWER EXTREMITY VENOUS (DVT)  Result Date: 01/13/2020  Lower Venous DVTStudy Indications: D dimer.  Comparison Study: no prior Performing Technologist: Jeb Levering RDMS, RVT  Examination Guidelines: A complete evaluation includes B-mode imaging, spectral Doppler, color Doppler, and power Doppler as needed of all accessible portions of each vessel. Bilateral testing is considered an integral part of a complete examination. Limited examinations for reoccurring indications may be performed as noted. The reflux portion of the exam is performed with the patient in reverse Trendelenburg.  +---------+---------------+---------+-----------+----------+--------------+ RIGHT    CompressibilityPhasicitySpontaneityPropertiesThrombus Aging +---------+---------------+---------+-----------+----------+--------------+ CFV                                                   Not visualized +---------+---------------+---------+-----------+----------+--------------+ FV Prox  Full           Yes      Yes                                  +---------+---------------+---------+-----------+----------+--------------+ FV Mid   Full                                                        +---------+---------------+---------+-----------+----------+--------------+ FV DistalFull                                                        +---------+---------------+---------+-----------+----------+--------------+ PFV      Full                                                        +---------+---------------+---------+-----------+----------+--------------+  POP      Full           Yes      Yes                                 +---------+---------------+---------+-----------+----------+--------------+ PTV      None                                         Acute          +---------+---------------+---------+-----------+----------+--------------+ PERO     Full                                                        +---------+---------------+---------+-----------+----------+--------------+ Soleal   None                                         Acute          +---------+---------------+---------+-----------+----------+--------------+   +---------+---------------+---------+-----------+----------+--------------+ LEFT     CompressibilityPhasicitySpontaneityPropertiesThrombus Aging +---------+---------------+---------+-----------+----------+--------------+ CFV                                                   Not visualized +---------+---------------+---------+-----------+----------+--------------+ FV Prox  Full           Yes      Yes                                 +---------+---------------+---------+-----------+----------+--------------+ FV Mid   Full                                                        +---------+---------------+---------+-----------+----------+--------------+ FV DistalFull                                                         +---------+---------------+---------+-----------+----------+--------------+ PFV      Full                                                        +---------+---------------+---------+-----------+----------+--------------+ POP      Full           Yes      Yes                                 +---------+---------------+---------+-----------+----------+--------------+ PTV      Full                                                        +---------+---------------+---------+-----------+----------+--------------+  PERO     Full                                                        +---------+---------------+---------+-----------+----------+--------------+ Soleal   None                                         Acute          +---------+---------------+---------+-----------+----------+--------------+     Summary: RIGHT: - Findings consistent with acute deep vein thrombosis involving the right posterior tibial veins, and right soleal veins. - No cystic structure found in the popliteal fossa.  LEFT: - Findings consistent with acute deep vein thrombosis involving the left soleal veins. - No cystic structure found in the popliteal fossa.  *See table(s) above for measurements and observations. Electronically signed by Coral Else MD on 01/13/2020 at 8:39:37 PM.    Final      PERTINENT LAB RESULTS: CBC: Recent Labs    01/14/20 0240 01/15/20 0556  WBC 9.1 8.2  HGB 13.3 13.4  HCT 41.3 41.7  PLT 383 395   CMET CMP     Component Value Date/Time   NA 140 01/15/2020 0556   K 4.4 01/15/2020 0556   CL 102 01/15/2020 0556   CO2 27 01/15/2020 0556   GLUCOSE 90 01/15/2020 0556   BUN 20 01/15/2020 0556   CREATININE 1.00 01/15/2020 0556   CALCIUM 8.8 (L) 01/15/2020 0556   PROT 6.4 (L) 01/15/2020 0556   ALBUMIN 2.3 (L) 01/15/2020 0556   AST 29 01/15/2020 0556   ALT 18 01/15/2020 0556   ALKPHOS 46 01/15/2020 0556   BILITOT 0.6 01/15/2020 0556   GFRNONAA >60 01/15/2020 0556    GFRAA >60 01/15/2020 0556    GFR Estimated Creatinine Clearance: 99.6 mL/min (by C-G formula based on SCr of 1 mg/dL). No results for input(s): LIPASE, AMYLASE in the last 72 hours. No results for input(s): CKTOTAL, CKMB, CKMBINDEX, TROPONINI in the last 72 hours. Invalid input(s): POCBNP Recent Labs    01/14/20 0240 01/15/20 0556  DDIMER 11.48* 7.88*   Recent Labs    01/14/20 0240  HGBA1C 9.6*   Recent Labs    01/12/20 2010  TRIG 124   No results for input(s): TSH, T4TOTAL, T3FREE, THYROIDAB in the last 72 hours.  Invalid input(s): FREET3 Recent Labs    01/14/20 0240 01/15/20 0556  FERRITIN 320 335   Coags: No results for input(s): INR in the last 72 hours.  Invalid input(s): PT Microbiology: Recent Results (from the past 240 hour(s))  Respiratory Panel by RT PCR (Flu A&B, Covid) - Nasopharyngeal Swab     Status: Abnormal   Collection Time: 01/12/20  8:11 PM   Specimen: Nasopharyngeal Swab  Result Value Ref Range Status   SARS Coronavirus 2 by RT PCR POSITIVE (A) NEGATIVE Final    Comment: RESULT CALLED TO, READ BACK BY AND VERIFIED WITH: P PEICKER 01/12/20 2214 JDW (NOTE) SARS-CoV-2 target nucleic acids are DETECTED. SARS-CoV-2 RNA is generally detectable in upper respiratory specimens  during the acute phase of infection. Positive results are indicative of the presence of the identified virus, but do not rule out bacterial infection or co-infection with other pathogens not detected by the test. Clinical  correlation with patient history and other diagnostic information is necessary to determine patient infection status. The expected result is Negative. Fact Sheet for Patients:  https://www.moore.com/ Fact Sheet for Healthcare Providers: https://www.young.biz/ This test is not yet approved or cleared by the [place] FDA and  has been authorized for detection and/or diagnosis of SARS-CoV-2 by FDA under an Emergency  Use Authorization (EUA).  This EUA will remain in effect (meaning this test can be used) for the dur ation of  the COVID-19 declaration under Section 564(b)(1) of the Act, 21 U.S.C. section 360bbb-3(b)(1), unless the authorization is terminated or revoked sooner.    Influenza A by PCR NEGATIVE NEGATIVE Final   Influenza B by PCR NEGATIVE NEGATIVE Final    Comment: (NOTE) The Xpert Xpress SARS-CoV-2/FLU/RSV assay is intended as an aid in  the diagnosis of influenza from Nasopharyngeal swab specimens and  should not be used as a sole basis for treatment. Nasal washings and  aspirates are unacceptable for Xpert Xpress SARS-CoV-2/FLU/RSV  testing. Fact Sheet for Patients: https://www.moore.com/ Fact Sheet for Healthcare Providers: https://www.young.biz/ This test is not yet approved or cleared by the [place] FDA and  has been authorized for detection and/or diagnosis of SARS-CoV-2 by  FDA under an Emergency Use Authorization (EUA). This EUA will remain  in effect (meaning this test can be used) for the duration of the  Covid-19 declaration under Section 564(b)(1) of the Act, 21  U.S.C. section 360bbb-3(b)(1), unless the authorization is  terminated or revoked. Performed at [facility], [address] [place], [place] [address]   Blood Culture (routine x 2)     Status: None (Preliminary result)   Collection Time: 01/12/20  8:35 PM   Specimen: BLOOD  Result Value Ref Range Status   Specimen Description BLOOD LEFT ANTECUBITAL  Final   Special Requests   Final    BOTTLES DRAWN AEROBIC AND ANAEROBIC Blood Culture results may not be optimal due to an inadequate volume of blood received in culture bottles   Culture   Final    NO GROWTH 3 DAYS Performed at [facility], [address] [place], [place] [address]    Report Status PENDING  Incomplete  Blood Culture (routine x 2)     Status: None (Preliminary result)   Collection  Time: 01/12/20  8:35 PM   Specimen: BLOOD  Result Value Ref Range Status   Specimen Description BLOOD RIGHT ARM  Final   Special Requests   Final    BOTTLES DRAWN AEROBIC AND ANAEROBIC Blood Culture adequate volume   Culture   Final    NO GROWTH 3 DAYS Performed at [facility], [address] [place], [place] [address]    Report Status PENDING  Incomplete    FURTHER DISCHARGE INSTRUCTIONS:  Get Medicines reviewed and adjusted: Please take all your medications with you for your next visit with your Primary MD  Laboratory/radiological data: Please request your Primary MD to go over all hospital tests and procedure/radiological results at the follow up, please ask your Primary MD to get all Hospital records sent to his/her office.  In some cases, they will be blood work, cultures and biopsy results pending at the time of your discharge. Please request that your primary care M.D. goes through all the records of your hospital data and follows up on these results.  Also Note the following: If you experience worsening of your admission symptoms, develop shortness of breath, life threatening emergency, suicidal  or homicidal thoughts you must seek medical attention immediately by calling 911 or calling your MD immediately  if symptoms less severe.  You must read complete instructions/literature along with all the possible adverse reactions/side effects for all the Medicines you take and that have been prescribed to you. Take any new Medicines after you have completely understood and accpet all the possible adverse reactions/side effects.   Do not drive when taking Pain medications or sleeping medications (Benzodaizepines)  Do not take more than prescribed Pain, Sleep and Anxiety Medications. It is not advisable to combine anxiety,sleep and pain medications without talking with your primary care practitioner  Special Instructions: If you have smoked or chewed Tobacco  in the last 2 yrs  please stop smoking, stop any regular Alcohol  and or any Recreational drug use.  Wear Seat belts while driving.  Please note: You were cared for by a hospitalist during your hospital stay. Once you are discharged, your primary care physician will handle any further medical issues. Please note that NO REFILLS for any discharge medications will be authorized once you are discharged, as it is imperative that you return to your primary care physician (or establish a relationship with a primary care physician if you do not have one) for your post hospital discharge needs so that they can reassess your need for medications and monitor your lab values.  Total Time spent coordinating discharge including counseling, education and face to face time equals 35 minutes.  SignedOren Binet 01/15/2020 11:48 AM

## 2020-01-16 ENCOUNTER — Ambulatory Visit (HOSPITAL_COMMUNITY)
Admit: 2020-01-16 | Discharge: 2020-01-16 | Disposition: A | Discharge status: Home / Self Care | Payer: BC Managed Care – PPO | Source: Ambulatory Visit | Attending: Pulmonary Disease | Admitting: Pulmonary Disease

## 2020-01-16 DIAGNOSIS — U071 COVID-19: Secondary | ICD-10-CM | POA: Diagnosis not present

## 2020-01-16 MED ORDER — METHYLPREDNISOLONE SODIUM SUCC 125 MG IJ SOLR: 125.0000 mg | Freq: Once | INTRAMUSCULAR | Status: DC | PRN

## 2020-01-16 MED ORDER — EPINEPHRINE 0.3 MG/0.3ML IJ SOAJ: 0.3000 mg | Freq: Once | INTRAMUSCULAR | Status: DC | PRN

## 2020-01-16 MED ORDER — DIPHENHYDRAMINE HCL 50 MG/ML IJ SOLN: 50.0000 mg | Freq: Once | INTRAMUSCULAR | Status: DC | PRN

## 2020-01-16 MED ORDER — ALBUTEROL SULFATE HFA 108 (90 BASE) MCG/ACT IN AERS: 2.0000 | INHALATION_SPRAY | Freq: Once | RESPIRATORY_TRACT | Status: DC | PRN

## 2020-01-16 MED ORDER — SODIUM CHLORIDE 0.9 % IV SOLN
100.0000 mg | Freq: Once | INTRAVENOUS | Status: AC
  Administered 2020-01-16: 100 mg via INTRAVENOUS
  Filled 2020-01-16: qty 20

## 2020-01-16 MED ORDER — SODIUM CHLORIDE 0.9 % IV SOLN: INTRAVENOUS | Status: DC | PRN

## 2020-01-16 MED ORDER — FAMOTIDINE IN NACL 20-0.9 MG/50ML-% IV SOLN: 20.0000 mg | Freq: Once | INTRAVENOUS | Status: DC | PRN

## 2020-01-16 NOTE — Discharge Instructions (Signed)
10 Things You Can Do to Manage Your COVID-19 Symptoms at Home If you have possible or confirmed COVID-19: 1. Stay home from work and school. And stay away from other public places. If you must go out, avoid using any kind of public transportation, ridesharing, or taxis. 2. Monitor your symptoms carefully. If your symptoms get worse, call your healthcare provider immediately. 3. Get rest and stay hydrated. 4. If you have a medical appointment, call the healthcare provider ahead of time and tell them that you have or may have COVID-19. 5. For medical emergencies, call 911 and notify the dispatch personnel that you have or may have COVID-19. 6. Cover your cough and sneezes with a tissue or use the inside of your elbow. 7. Wash your hands often with soap and water for at least 20 seconds or clean your hands with an alcohol-based hand sanitizer that contains at least 60% alcohol. 8. As much as possible, stay in a specific room and away from other people in your home. Also, you should use a separate bathroom, if available. If you need to be around other people in or outside of the home, wear a mask. 9. Avoid sharing personal items with other people in your household, like dishes, towels, and bedding. 10. Clean all surfaces that are touched often, like counters, tabletops, and doorknobs. Use household cleaning sprays or wipes according to the label instructions. cdc.gov/coronavirus 03/11/2019 This information is not intended to replace advice given to you by your health care provider. Make sure you discuss any questions you have with your health care provider. Document Revised: 08/13/2019 Document Reviewed: 08/13/2019 Elsevier Patient Education  2020 Elsevier Inc.  

## 2020-01-16 NOTE — Progress Notes (Signed)
  Diagnosis: COVID-19  Physician:dr wright   Procedure: [facility] Med: remdesivir infusion - Provided patient with remdesivir fact sheet for patients, parents and caregivers prior to infusion.  Complications: No immediate complications noted.  Discharge: Discharged home   Mark Boyer S Lova Urbieta 01/16/2020  

## 2020-01-17 ENCOUNTER — Ambulatory Visit (HOSPITAL_COMMUNITY)
Admit: 2020-01-17 | Discharge: 2020-01-17 | Disposition: A | Discharge status: Home / Self Care | Payer: BC Managed Care – PPO | Attending: Pulmonary Disease | Admitting: Pulmonary Disease

## 2020-01-17 DIAGNOSIS — U071 COVID-19: Secondary | ICD-10-CM | POA: Diagnosis not present

## 2020-01-17 LAB — CULTURE, BLOOD (ROUTINE X 2)
Culture: NO GROWTH
Culture: NO GROWTH
Special Requests: ADEQUATE

## 2020-01-17 MED ORDER — FAMOTIDINE IN NACL 20-0.9 MG/50ML-% IV SOLN: 20.0000 mg | Freq: Once | INTRAVENOUS | Status: DC | PRN

## 2020-01-17 MED ORDER — SODIUM CHLORIDE 0.9 % IV SOLN
100.0000 mg | Freq: Once | INTRAVENOUS | Status: AC
  Administered 2020-01-17: 10:00:00 100 mg via INTRAVENOUS
  Filled 2020-01-17: qty 20

## 2020-01-17 MED ORDER — SODIUM CHLORIDE 0.9 % IV SOLN: INTRAVENOUS | Status: DC | PRN

## 2020-01-17 MED ORDER — ALBUTEROL SULFATE HFA 108 (90 BASE) MCG/ACT IN AERS: 2.0000 | INHALATION_SPRAY | Freq: Once | RESPIRATORY_TRACT | Status: DC | PRN

## 2020-01-17 MED ORDER — METHYLPREDNISOLONE SODIUM SUCC 125 MG IJ SOLR: 125.0000 mg | Freq: Once | INTRAMUSCULAR | Status: DC | PRN

## 2020-01-17 MED ORDER — EPINEPHRINE 0.3 MG/0.3ML IJ SOAJ: 0.3000 mg | Freq: Once | INTRAMUSCULAR | Status: DC | PRN

## 2020-01-17 MED ORDER — DIPHENHYDRAMINE HCL 50 MG/ML IJ SOLN: 50.0000 mg | Freq: Once | INTRAMUSCULAR | Status: DC | PRN

## 2020-01-17 NOTE — Progress Notes (Signed)
  Diagnosis: COVID-19  Physician: Dr. Wright  Procedure: [facility] Med: remdesivir infusion - Provided patient with remdesivir fact sheet for patients, parents and caregivers prior to infusion.  Complications: No immediate complications noted.  Discharge: Discharged home   Mark Boyer [name] 01/17/2020  

## 2020-01-17 NOTE — Discharge Instructions (Signed)

## 2020-01-21 DIAGNOSIS — Z1331 Encounter for screening for depression: Secondary | ICD-10-CM | POA: Diagnosis not present

## 2020-01-21 DIAGNOSIS — U071 COVID-19: Secondary | ICD-10-CM | POA: Diagnosis not present

## 2020-01-21 DIAGNOSIS — E1165 Type 2 diabetes mellitus with hyperglycemia: Secondary | ICD-10-CM | POA: Diagnosis not present

## 2020-01-21 DIAGNOSIS — N179 Acute kidney failure, unspecified: Secondary | ICD-10-CM | POA: Diagnosis not present

## 2020-01-21 DIAGNOSIS — J1282 Pneumonia due to coronavirus disease 2019: Secondary | ICD-10-CM | POA: Diagnosis not present

## 2020-01-21 DIAGNOSIS — Z79899 Other long term (current) drug therapy: Secondary | ICD-10-CM | POA: Diagnosis not present

## 2020-01-21 DIAGNOSIS — I2699 Other pulmonary embolism without acute cor pulmonale: Secondary | ICD-10-CM | POA: Diagnosis not present

## 2020-02-13 ENCOUNTER — Ambulatory Visit: Payer: BC Managed Care – PPO

## 2020-02-13 ENCOUNTER — Ambulatory Visit: Payer: BC Managed Care – PPO | Attending: Internal Medicine

## 2020-02-13 DIAGNOSIS — Z23 Encounter for immunization: Secondary | ICD-10-CM

## 2020-02-13 NOTE — Progress Notes (Signed)
   Covid-19 Vaccination Clinic  Name:  Mark Boyer    MRN: [id number] DOB: Dec 26, 1960  02/13/2020  Mark Boyer was observed post Covid-19 immunization for 15 minutes without incident. He was provided with Vaccine Information Sheet and instruction to access the V-Safe system.   Mark Boyer was instructed to call 911 with any severe reactions post vaccine: Marland Kitchen Difficulty breathing  . Swelling of face and throat  . A fast heartbeat  . A bad rash all over body  . Dizziness and weakness   Immunizations Administered    Name Date Dose VIS Date Route   Pfizer COVID-19 Vaccine 02/13/2020  8:20 AM 0.3 mL 11/04/2018 Intramuscular   Manufacturer: [employer], [employer]   Lot: [id number]   NDC: [address]-3

## 2020-02-22 DIAGNOSIS — E113511 Type 2 diabetes mellitus with proliferative diabetic retinopathy with macular edema, right eye: Secondary | ICD-10-CM | POA: Diagnosis not present

## 2020-04-28 DIAGNOSIS — E1165 Type 2 diabetes mellitus with hyperglycemia: Secondary | ICD-10-CM | POA: Diagnosis not present

## 2020-04-28 DIAGNOSIS — E7800 Pure hypercholesterolemia, unspecified: Secondary | ICD-10-CM | POA: Diagnosis not present

## 2020-04-28 DIAGNOSIS — Z125 Encounter for screening for malignant neoplasm of prostate: Secondary | ICD-10-CM | POA: Diagnosis not present

## 2020-04-28 DIAGNOSIS — I2699 Other pulmonary embolism without acute cor pulmonale: Secondary | ICD-10-CM | POA: Diagnosis not present

## 2020-05-02 DIAGNOSIS — E113511 Type 2 diabetes mellitus with proliferative diabetic retinopathy with macular edema, right eye: Secondary | ICD-10-CM | POA: Diagnosis not present

## 2020-06-17 DIAGNOSIS — R52 Pain, unspecified: Secondary | ICD-10-CM | POA: Diagnosis not present

## 2020-06-17 DIAGNOSIS — J3489 Other specified disorders of nose and nasal sinuses: Secondary | ICD-10-CM | POA: Diagnosis not present

## 2020-06-17 DIAGNOSIS — Z20828 Contact with and (suspected) exposure to other viral communicable diseases: Secondary | ICD-10-CM | POA: Diagnosis not present

## 2020-08-01 DIAGNOSIS — E113511 Type 2 diabetes mellitus with proliferative diabetic retinopathy with macular edema, right eye: Secondary | ICD-10-CM | POA: Diagnosis not present

## 2020-08-15 ENCOUNTER — Emergency Department (HOSPITAL_COMMUNITY)
Admission: EM | Admit: 2020-08-15 | Discharge: 2020-08-15 | Disposition: A | Discharge status: Home / Self Care | Payer: No Typology Code available for payment source | Attending: Emergency Medicine | Admitting: Emergency Medicine

## 2020-08-15 ENCOUNTER — Emergency Department (HOSPITAL_COMMUNITY): Payer: No Typology Code available for payment source

## 2020-08-15 DIAGNOSIS — Z794 Long term (current) use of insulin: Secondary | ICD-10-CM | POA: Insufficient documentation

## 2020-08-15 DIAGNOSIS — E1165 Type 2 diabetes mellitus with hyperglycemia: Secondary | ICD-10-CM | POA: Diagnosis not present

## 2020-08-15 DIAGNOSIS — Z7984 Long term (current) use of oral hypoglycemic drugs: Secondary | ICD-10-CM | POA: Insufficient documentation

## 2020-08-15 DIAGNOSIS — I10 Essential (primary) hypertension: Secondary | ICD-10-CM | POA: Diagnosis not present

## 2020-08-15 DIAGNOSIS — Z87891 Personal history of nicotine dependence: Secondary | ICD-10-CM | POA: Insufficient documentation

## 2020-08-15 DIAGNOSIS — R059 Cough, unspecified: Secondary | ICD-10-CM | POA: Diagnosis not present

## 2020-08-15 DIAGNOSIS — T22012A Burn of unspecified degree of left forearm, initial encounter: Secondary | ICD-10-CM | POA: Diagnosis not present

## 2020-08-15 DIAGNOSIS — Z23 Encounter for immunization: Secondary | ICD-10-CM | POA: Insufficient documentation

## 2020-08-15 DIAGNOSIS — R0602 Shortness of breath: Secondary | ICD-10-CM | POA: Diagnosis not present

## 2020-08-15 DIAGNOSIS — J705 Respiratory conditions due to smoke inhalation: Secondary | ICD-10-CM | POA: Diagnosis present

## 2020-08-15 DIAGNOSIS — Z8616 Personal history of COVID-19: Secondary | ICD-10-CM | POA: Insufficient documentation

## 2020-08-15 DIAGNOSIS — T310 Burns involving less than 10% of body surface: Secondary | ICD-10-CM | POA: Diagnosis not present

## 2020-08-15 DIAGNOSIS — T59811A Toxic effect of smoke, accidental (unintentional), initial encounter: Secondary | ICD-10-CM | POA: Diagnosis not present

## 2020-08-15 DIAGNOSIS — R000 Tachycardia, unspecified: Secondary | ICD-10-CM | POA: Diagnosis not present

## 2020-08-15 DIAGNOSIS — T59814A Toxic effect of smoke, undetermined, initial encounter: Secondary | ICD-10-CM | POA: Diagnosis not present

## 2020-08-15 DIAGNOSIS — X008XXA Other exposure to uncontrolled fire in building or structure, initial encounter: Secondary | ICD-10-CM | POA: Insufficient documentation

## 2020-08-15 DIAGNOSIS — T22212A Burn of second degree of left forearm, initial encounter: Secondary | ICD-10-CM | POA: Diagnosis not present

## 2020-08-15 LAB — COOXEMETRY PANEL
Carboxyhemoglobin: 0.8 % (ref 0.5–1.5)
Methemoglobin: 1 % (ref 0.0–1.5)
O2 Saturation: 43.2 %
Total hemoglobin: 14.3 g/dL (ref 12.0–16.0)

## 2020-08-15 LAB — I-STAT ARTERIAL BLOOD GAS, ED
Acid-Base Excess: 0 mmol/L (ref 0.0–2.0)
Bicarbonate: 24.9 mmol/L (ref 20.0–28.0)
Calcium, Ion: 1.29 mmol/L (ref 1.15–1.40)
HCT: 43 % (ref 39.0–52.0)
Hemoglobin: 14.6 g/dL (ref 13.0–17.0)
O2 Saturation: 95 %
Patient temperature: 98.5
Potassium: 4.1 mmol/L (ref 3.5–5.1)
Sodium: 139 mmol/L (ref 135–145)
TCO2: 26 mmol/L (ref 22–32)
pCO2 arterial: 42 mmHg (ref 32.0–48.0)
pH, Arterial: 7.379 (ref 7.350–7.450)
pO2, Arterial: 79 mmHg — ABNORMAL LOW (ref 83.0–108.0)

## 2020-08-15 MED ORDER — TETANUS-DIPHTH-ACELL PERTUSSIS 5-2.5-18.5 LF-MCG/0.5 IM SUSY
0.5000 mL | PREFILLED_SYRINGE | Freq: Once | INTRAMUSCULAR | Status: AC
  Administered 2020-08-15: 0.5 mL via INTRAMUSCULAR
  Filled 2020-08-15: qty 0.5

## 2020-08-15 MED ORDER — SILVER SULFADIAZINE 1 % EX CREA
1.0000 | TOPICAL_CREAM | Freq: Every day | CUTANEOUS | 1 refills | Status: AC
Start: 2020-08-15 — End: ?

## 2020-08-15 MED ORDER — SILVER SULFADIAZINE 1 % EX CREA
TOPICAL_CREAM | Freq: Once | CUTANEOUS | Status: AC
  Administered 2020-08-15: 1 via TOPICAL
  Filled 2020-08-15: qty 85
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
## 2020-09-14 DIAGNOSIS — Z20822 Contact with and (suspected) exposure to covid-19: Secondary | ICD-10-CM | POA: Diagnosis not present

## 2020-09-22 DIAGNOSIS — E7800 Pure hypercholesterolemia, unspecified: Secondary | ICD-10-CM | POA: Diagnosis not present

## 2020-09-22 DIAGNOSIS — N529 Male erectile dysfunction, unspecified: Secondary | ICD-10-CM | POA: Diagnosis not present

## 2020-09-22 DIAGNOSIS — Z6834 Body mass index (BMI) 34.0-34.9, adult: Secondary | ICD-10-CM | POA: Diagnosis not present

## 2020-09-22 DIAGNOSIS — E1165 Type 2 diabetes mellitus with hyperglycemia: Secondary | ICD-10-CM | POA: Diagnosis not present

## 2020-10-24 DIAGNOSIS — E113511 Type 2 diabetes mellitus with proliferative diabetic retinopathy with macular edema, right eye: Secondary | ICD-10-CM | POA: Diagnosis not present

## 2020-12-22 DIAGNOSIS — E1165 Type 2 diabetes mellitus with hyperglycemia: Secondary | ICD-10-CM | POA: Diagnosis not present

## 2020-12-22 DIAGNOSIS — R29898 Other symptoms and signs involving the musculoskeletal system: Secondary | ICD-10-CM | POA: Diagnosis not present

## 2020-12-22 DIAGNOSIS — E7800 Pure hypercholesterolemia, unspecified: Secondary | ICD-10-CM | POA: Diagnosis not present

## 2020-12-22 DIAGNOSIS — N529 Male erectile dysfunction, unspecified: Secondary | ICD-10-CM | POA: Diagnosis not present

## 2021-01-01 IMAGING — CT CT ANGIO CHEST
2 of 6 series · 17 of 46 positions shown · IV contrast (omnipaque)
Comparison: None.
COMPARISON: None.

Addendum:
CLINICAL DATA: Shortness of breath.

EXAM:
CT ANGIOGRAPHY CHEST WITH CONTRAST
TECHNIQUE: Multidetector CT imaging of the chest was performed using the
standard protocol during bolus administration of intravenous
contrast. Multiplanar CT image reconstructions and MIPs were
obtained to evaluate the vascular anatomy.
CONTRAST:  50mL OMNIPAQUE IOHEXOL 350 MG/ML SOLN

[Series 6: thins · axial · 0.75mm/px · z∈[-328,-60]mm · 14 of 294 slices shown]
[im 13/294  lung]
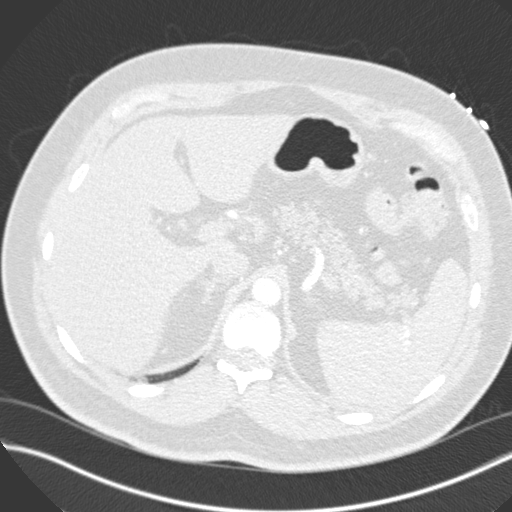
[im 39/294  soft-tissue]
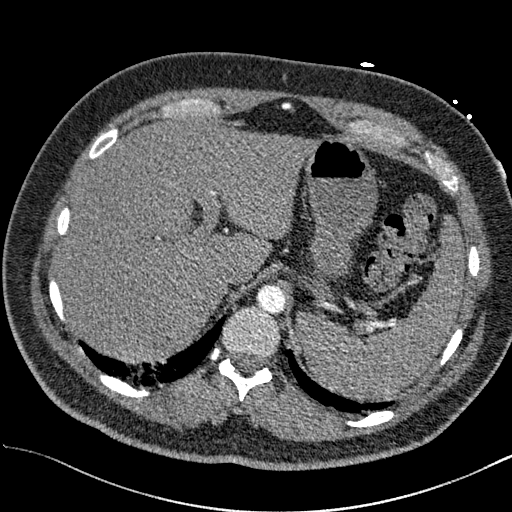
[im 51/294  lung]
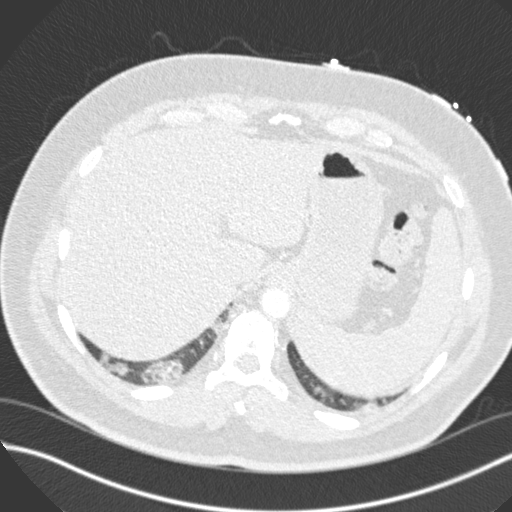
[im 77/294  soft-tissue]
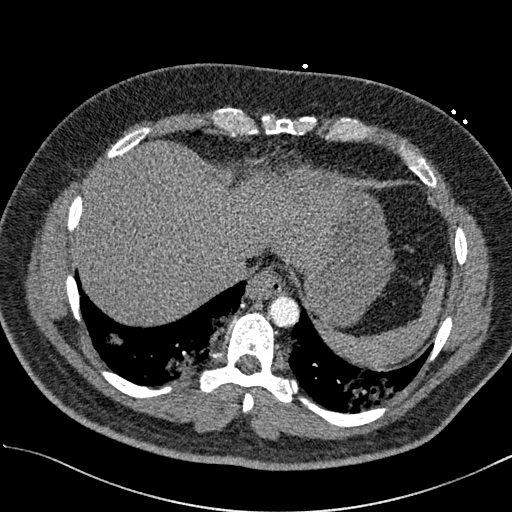
[im 102/294  lung]
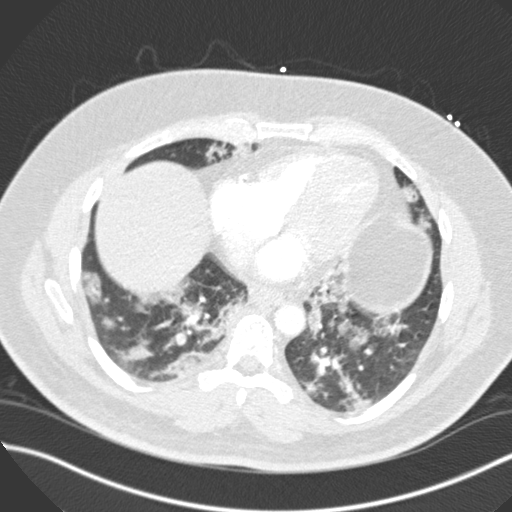
[im 115/294  soft-tissue]
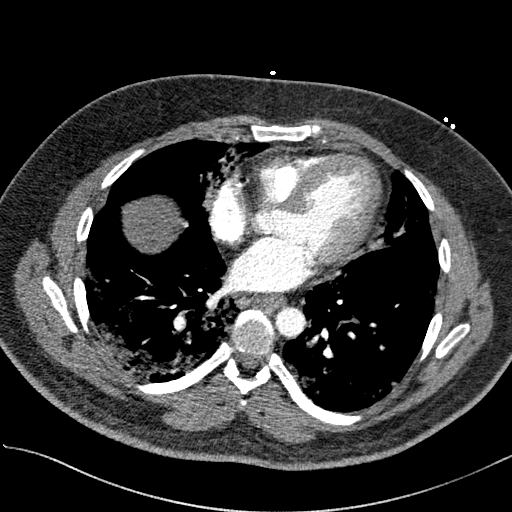
[im 141/294  lung]
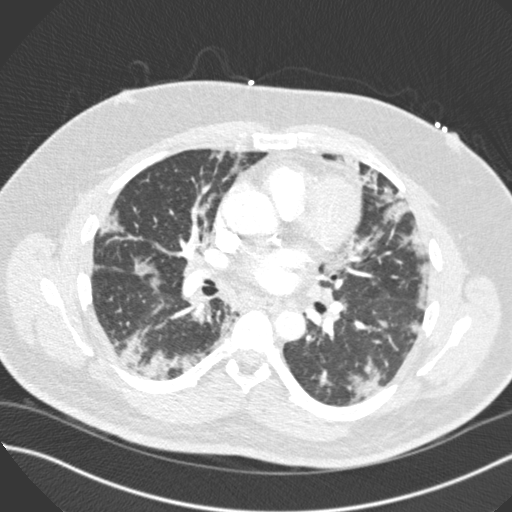
[im 153/294  soft-tissue]
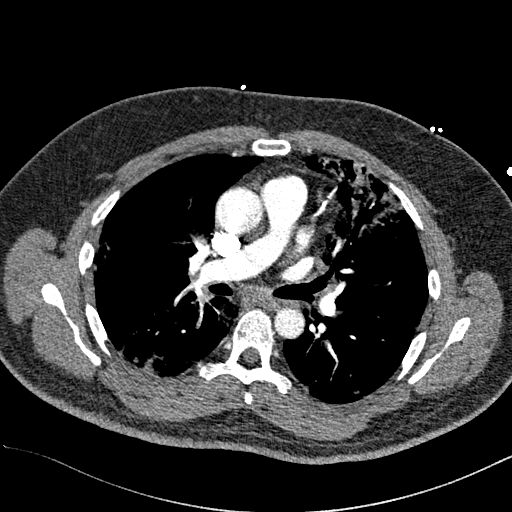
[im 179/294  lung]
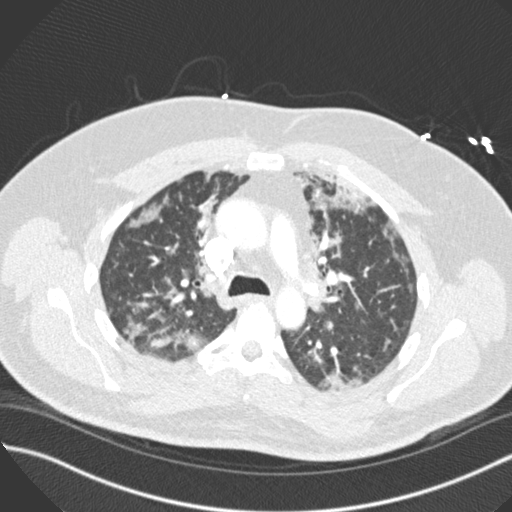
[im 192/294  soft-tissue]
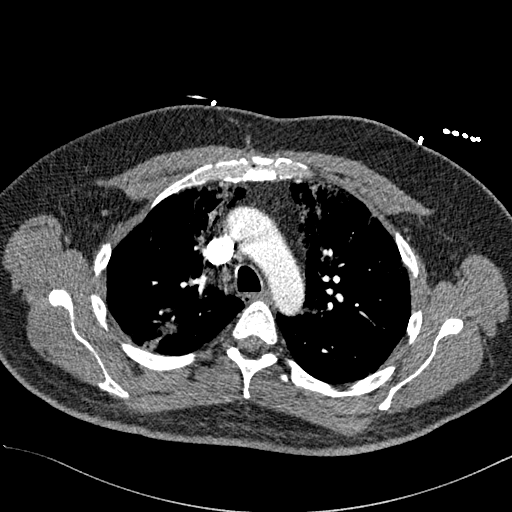
[im 217/294  lung]
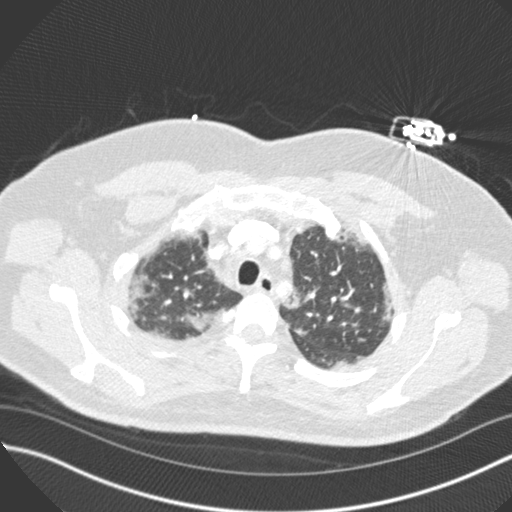
[im 243/294  soft-tissue]
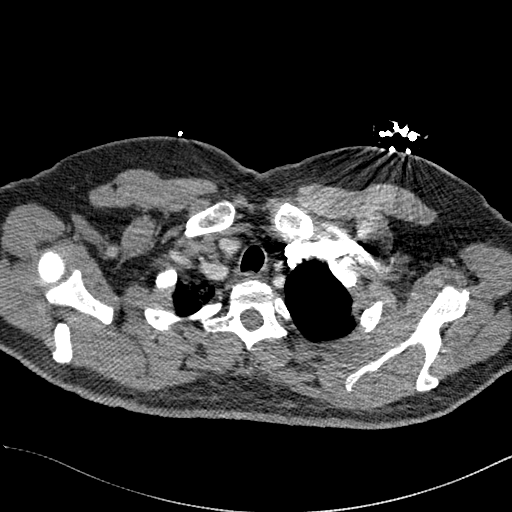
[im 255/294  lung]
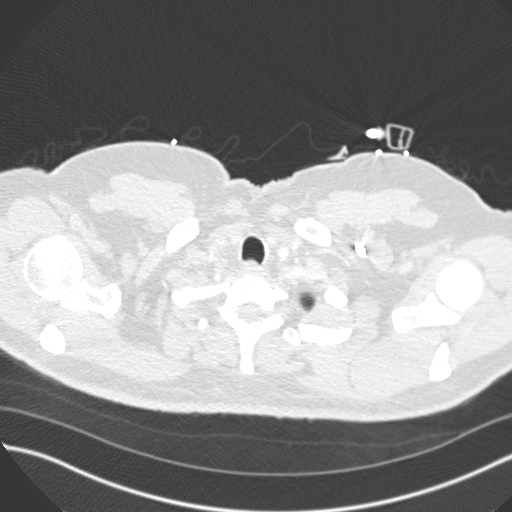
[im 281/294  soft-tissue]
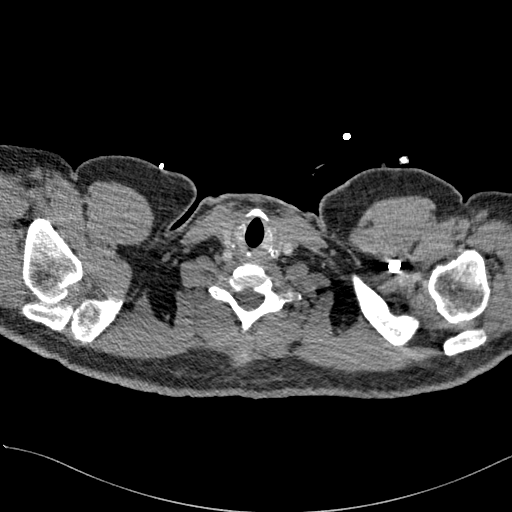

[Series 8: coronal mpr · coronal · 0.59mm/px · 3 of 151 slices shown]
[im 38/151  soft-tissue]
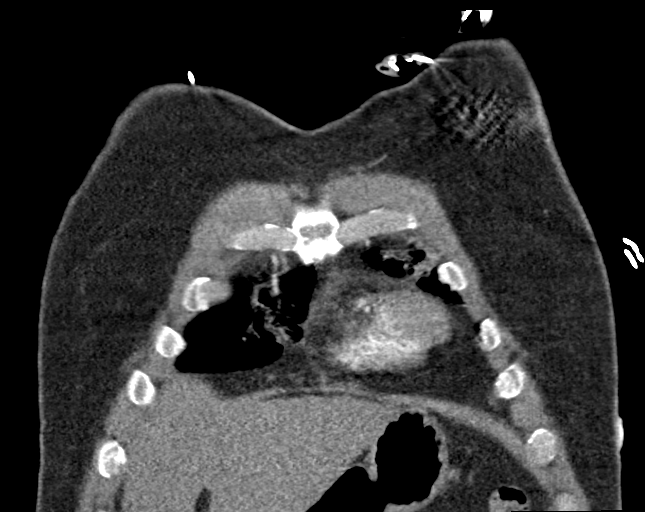
[im 76/151  soft-tissue]
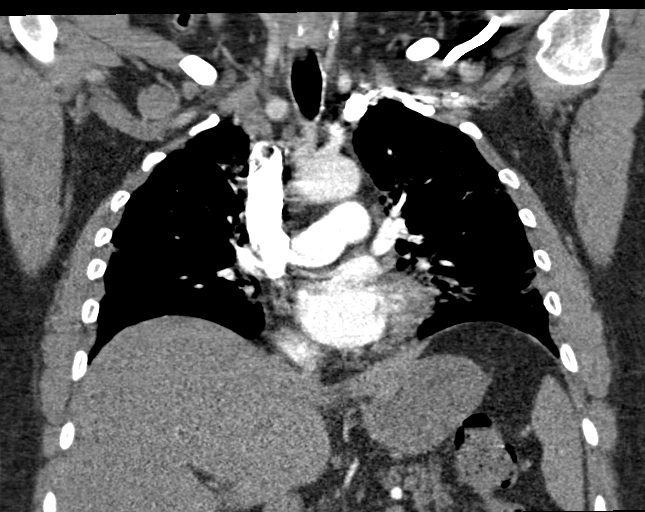
[im 113/151  soft-tissue]
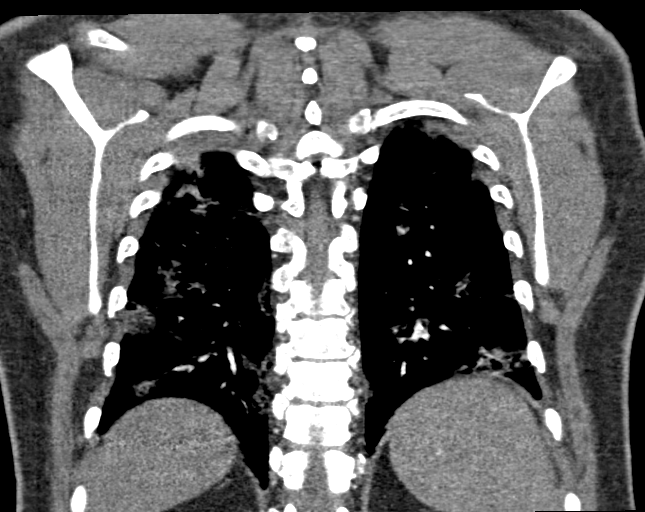

[17 of 46 positions shown; findings below may reference images not displayed]

FINDINGS: Cardiovascular: Filling defect is noted in lower lobe branch of
right pulmonary artery consistent with pulmonary embolus. Normal
cardiac size. No pericardial effusion. Coronary artery
calcifications are noted. Atherosclerosis of thoracic aorta is noted
without aneurysm formation.

Mediastinum/Nodes: No enlarged mediastinal, hilar, or axillary lymph
nodes. Thyroid gland, trachea, and esophagus demonstrate no
significant findings.

Lungs/Pleura: No pneumothorax or pleural effusion is noted. Multiple
airspace opacities are noted throughout both lungs consistent with
multifocal pneumonia, potentially of viral etiology.

Upper Abdomen: No acute abnormality.

Musculoskeletal: No chest wall abnormality. No acute or significant
osseous findings.

Review of the MIP images confirms the above findings.
IMPRESSION: 1. Filling defect is noted in lower lobe branch of right pulmonary
artery consistent with pulmonary embolus.
2. Coronary artery calcifications are noted suggesting coronary
artery disease.
3. Multiple airspace opacities are noted throughout both lungs
consistent with multifocal pneumonia, potentially of viral etiology.

Aortic Atherosclerosis (KX8SS-CE3.3).

ADDENDUM:
Critical Value/emergent results were called by telephone at the time
of interpretation on 01/13/2020 at 1:o0 pm to provider Dr. Sorin,
who verbally acknowledged these results.

*** End of Addendum ***
FINDINGS: Cardiovascular: Filling defect is noted in lower lobe branch of
right pulmonary artery consistent with pulmonary embolus. Normal
cardiac size. No pericardial effusion. Coronary artery
calcifications are noted. Atherosclerosis of thoracic aorta is noted
without aneurysm formation.

Mediastinum/Nodes: No enlarged mediastinal, hilar, or axillary lymph
nodes. Thyroid gland, trachea, and esophagus demonstrate no
significant findings.

Lungs/Pleura: No pneumothorax or pleural effusion is noted. Multiple
airspace opacities are noted throughout both lungs consistent with
multifocal pneumonia, potentially of viral etiology.

Upper Abdomen: No acute abnormality.

Musculoskeletal: No chest wall abnormality. No acute or significant
osseous findings.

Review of the MIP images confirms the above findings.
IMPRESSION: 1. Filling defect is noted in lower lobe branch of right pulmonary
artery consistent with pulmonary embolus.
2. Coronary artery calcifications are noted suggesting coronary
artery disease.
3. Multiple airspace opacities are noted throughout both lungs
consistent with multifocal pneumonia, potentially of viral etiology.

Aortic Atherosclerosis (KX8SS-CE3.3).

## 2021-01-16 DIAGNOSIS — E113511 Type 2 diabetes mellitus with proliferative diabetic retinopathy with macular edema, right eye: Secondary | ICD-10-CM | POA: Diagnosis not present

## 2021-02-09 DIAGNOSIS — Z20828 Contact with and (suspected) exposure to other viral communicable diseases: Secondary | ICD-10-CM | POA: Diagnosis not present

## 2021-02-09 DIAGNOSIS — R051 Acute cough: Secondary | ICD-10-CM | POA: Diagnosis not present

## 2021-02-09 DIAGNOSIS — R0981 Nasal congestion: Secondary | ICD-10-CM | POA: Diagnosis not present

## 2021-02-09 DIAGNOSIS — J019 Acute sinusitis, unspecified: Secondary | ICD-10-CM | POA: Diagnosis not present

## 2021-02-26 DIAGNOSIS — Z20828 Contact with and (suspected) exposure to other viral communicable diseases: Secondary | ICD-10-CM | POA: Diagnosis not present

## 2021-02-26 DIAGNOSIS — R0902 Hypoxemia: Secondary | ICD-10-CM | POA: Diagnosis not present

## 2021-02-26 DIAGNOSIS — K449 Diaphragmatic hernia without obstruction or gangrene: Secondary | ICD-10-CM | POA: Diagnosis not present

## 2021-02-26 DIAGNOSIS — R531 Weakness: Secondary | ICD-10-CM | POA: Diagnosis not present

## 2021-02-26 DIAGNOSIS — I517 Cardiomegaly: Secondary | ICD-10-CM | POA: Diagnosis not present

## 2021-02-26 DIAGNOSIS — R0602 Shortness of breath: Secondary | ICD-10-CM | POA: Diagnosis not present

## 2021-02-26 DIAGNOSIS — J189 Pneumonia, unspecified organism: Secondary | ICD-10-CM | POA: Diagnosis not present

## 2021-02-26 DIAGNOSIS — J9811 Atelectasis: Secondary | ICD-10-CM | POA: Diagnosis not present

## 2021-03-01 DIAGNOSIS — J189 Pneumonia, unspecified organism: Secondary | ICD-10-CM | POA: Diagnosis not present

## 2021-03-01 DIAGNOSIS — Z6834 Body mass index (BMI) 34.0-34.9, adult: Secondary | ICD-10-CM | POA: Diagnosis not present

## 2021-03-01 DIAGNOSIS — Z79899 Other long term (current) drug therapy: Secondary | ICD-10-CM | POA: Diagnosis not present

## 2021-04-07 DIAGNOSIS — Z1331 Encounter for screening for depression: Secondary | ICD-10-CM | POA: Diagnosis not present

## 2021-04-07 DIAGNOSIS — E7800 Pure hypercholesterolemia, unspecified: Secondary | ICD-10-CM | POA: Diagnosis not present

## 2021-04-07 DIAGNOSIS — N529 Male erectile dysfunction, unspecified: Secondary | ICD-10-CM | POA: Diagnosis not present

## 2021-04-07 DIAGNOSIS — E11319 Type 2 diabetes mellitus with unspecified diabetic retinopathy without macular edema: Secondary | ICD-10-CM | POA: Diagnosis not present

## 2021-04-10 DIAGNOSIS — E113511 Type 2 diabetes mellitus with proliferative diabetic retinopathy with macular edema, right eye: Secondary | ICD-10-CM | POA: Diagnosis not present

## 2021-05-29 DIAGNOSIS — E113511 Type 2 diabetes mellitus with proliferative diabetic retinopathy with macular edema, right eye: Secondary | ICD-10-CM | POA: Diagnosis not present
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
## 2022-08-14 DIAGNOSIS — Z23 Encounter for immunization: Secondary | ICD-10-CM | POA: Diagnosis not present

## 2022-08-14 DIAGNOSIS — E11319 Type 2 diabetes mellitus with unspecified diabetic retinopathy without macular edema: Secondary | ICD-10-CM | POA: Diagnosis not present

## 2022-08-14 DIAGNOSIS — E7800 Pure hypercholesterolemia, unspecified: Secondary | ICD-10-CM | POA: Diagnosis not present

## 2022-11-08 DIAGNOSIS — J0100 Acute maxillary sinusitis, unspecified: Secondary | ICD-10-CM | POA: Diagnosis not present

## 2022-11-08 DIAGNOSIS — Z20822 Contact with and (suspected) exposure to covid-19: Secondary | ICD-10-CM | POA: Diagnosis not present

## 2022-11-08 DIAGNOSIS — E118 Type 2 diabetes mellitus with unspecified complications: Secondary | ICD-10-CM | POA: Diagnosis not present

## 2022-11-13 DIAGNOSIS — E7800 Pure hypercholesterolemia, unspecified: Secondary | ICD-10-CM | POA: Diagnosis not present

## 2022-11-13 DIAGNOSIS — E11319 Type 2 diabetes mellitus with unspecified diabetic retinopathy without macular edema: Secondary | ICD-10-CM | POA: Diagnosis not present

## 2022-12-03 DIAGNOSIS — E113593 Type 2 diabetes mellitus with proliferative diabetic retinopathy without macular edema, bilateral: Secondary | ICD-10-CM | POA: Diagnosis not present

## 2022-12-03 DIAGNOSIS — H5201 Hypermetropia, right eye: Secondary | ICD-10-CM | POA: Diagnosis not present

## 2023-02-25 DIAGNOSIS — E11319 Type 2 diabetes mellitus with unspecified diabetic retinopathy without macular edema: Secondary | ICD-10-CM | POA: Diagnosis not present

## 2023-02-25 DIAGNOSIS — E7800 Pure hypercholesterolemia, unspecified: Secondary | ICD-10-CM | POA: Diagnosis not present

## 2023-05-27 DIAGNOSIS — H43821 Vitreomacular adhesion, right eye: Secondary | ICD-10-CM | POA: Diagnosis not present

## 2023-05-27 DIAGNOSIS — H31093 Other chorioretinal scars, bilateral: Secondary | ICD-10-CM | POA: Diagnosis not present

## 2023-05-27 DIAGNOSIS — E113593 Type 2 diabetes mellitus with proliferative diabetic retinopathy without macular edema, bilateral: Secondary | ICD-10-CM | POA: Diagnosis not present

## 2023-05-27 DIAGNOSIS — H4311 Vitreous hemorrhage, right eye: Secondary | ICD-10-CM | POA: Diagnosis not present

## 2023-06-04 DIAGNOSIS — E113593 Type 2 diabetes mellitus with proliferative diabetic retinopathy without macular edema, bilateral: Secondary | ICD-10-CM | POA: Diagnosis not present

## 2023-06-21 DIAGNOSIS — Z23 Encounter for immunization: Secondary | ICD-10-CM | POA: Diagnosis not present

## 2023-06-21 DIAGNOSIS — Z1331 Encounter for screening for depression: Secondary | ICD-10-CM | POA: Diagnosis not present

## 2023-06-21 DIAGNOSIS — E7800 Pure hypercholesterolemia, unspecified: Secondary | ICD-10-CM | POA: Diagnosis not present

## 2023-06-21 DIAGNOSIS — E11319 Type 2 diabetes mellitus with unspecified diabetic retinopathy without macular edema: Secondary | ICD-10-CM | POA: Diagnosis not present

## 2023-06-21 DIAGNOSIS — Z125 Encounter for screening for malignant neoplasm of prostate: Secondary | ICD-10-CM | POA: Diagnosis not present

## 2023-07-09 DIAGNOSIS — H31093 Other chorioretinal scars, bilateral: Secondary | ICD-10-CM | POA: Diagnosis not present

## 2023-07-09 DIAGNOSIS — H4311 Vitreous hemorrhage, right eye: Secondary | ICD-10-CM | POA: Diagnosis not present

## 2023-07-09 DIAGNOSIS — E113511 Type 2 diabetes mellitus with proliferative diabetic retinopathy with macular edema, right eye: Secondary | ICD-10-CM | POA: Diagnosis not present

## 2023-07-09 DIAGNOSIS — H43821 Vitreomacular adhesion, right eye: Secondary | ICD-10-CM | POA: Diagnosis not present

## 2023-07-22 DIAGNOSIS — T25221A Burn of second degree of right foot, initial encounter: Secondary | ICD-10-CM | POA: Diagnosis not present

## 2023-07-22 DIAGNOSIS — E118 Type 2 diabetes mellitus with unspecified complications: Secondary | ICD-10-CM | POA: Diagnosis not present

## 2023-08-20 DIAGNOSIS — E113511 Type 2 diabetes mellitus with proliferative diabetic retinopathy with macular edema, right eye: Secondary | ICD-10-CM | POA: Diagnosis not present

## 2023-09-22 DIAGNOSIS — U071 COVID-19: Secondary | ICD-10-CM | POA: Diagnosis not present

## 2023-10-08 DIAGNOSIS — E113591 Type 2 diabetes mellitus with proliferative diabetic retinopathy without macular edema, right eye: Secondary | ICD-10-CM | POA: Diagnosis not present

## 2023-10-08 DIAGNOSIS — H31093 Other chorioretinal scars, bilateral: Secondary | ICD-10-CM | POA: Diagnosis not present

## 2023-10-08 DIAGNOSIS — H43821 Vitreomacular adhesion, right eye: Secondary | ICD-10-CM | POA: Diagnosis not present

## 2023-10-08 DIAGNOSIS — H4311 Vitreous hemorrhage, right eye: Secondary | ICD-10-CM | POA: Diagnosis not present

## 2023-10-10 DIAGNOSIS — E11319 Type 2 diabetes mellitus with unspecified diabetic retinopathy without macular edema: Secondary | ICD-10-CM | POA: Diagnosis not present

## 2023-10-10 DIAGNOSIS — J309 Allergic rhinitis, unspecified: Secondary | ICD-10-CM | POA: Diagnosis not present

## 2023-10-10 DIAGNOSIS — E7800 Pure hypercholesterolemia, unspecified: Secondary | ICD-10-CM | POA: Diagnosis not present

## 2023-11-19 DIAGNOSIS — H43821 Vitreomacular adhesion, right eye: Secondary | ICD-10-CM | POA: Diagnosis not present

## 2023-11-19 DIAGNOSIS — E113511 Type 2 diabetes mellitus with proliferative diabetic retinopathy with macular edema, right eye: Secondary | ICD-10-CM | POA: Diagnosis not present

## 2023-11-19 DIAGNOSIS — H31093 Other chorioretinal scars, bilateral: Secondary | ICD-10-CM | POA: Diagnosis not present

## 2023-11-19 DIAGNOSIS — H4311 Vitreous hemorrhage, right eye: Secondary | ICD-10-CM | POA: Diagnosis not present

## 2023-12-31 DIAGNOSIS — E113511 Type 2 diabetes mellitus with proliferative diabetic retinopathy with macular edema, right eye: Secondary | ICD-10-CM | POA: Diagnosis not present

## 2024-01-31 DIAGNOSIS — E11319 Type 2 diabetes mellitus with unspecified diabetic retinopathy without macular edema: Secondary | ICD-10-CM | POA: Diagnosis not present

## 2024-01-31 DIAGNOSIS — E7800 Pure hypercholesterolemia, unspecified: Secondary | ICD-10-CM | POA: Diagnosis not present

## 2024-02-11 DIAGNOSIS — H4311 Vitreous hemorrhage, right eye: Secondary | ICD-10-CM | POA: Diagnosis not present

## 2024-02-11 DIAGNOSIS — E113511 Type 2 diabetes mellitus with proliferative diabetic retinopathy with macular edema, right eye: Secondary | ICD-10-CM | POA: Diagnosis not present

## 2024-02-11 DIAGNOSIS — H43821 Vitreomacular adhesion, right eye: Secondary | ICD-10-CM | POA: Diagnosis not present

## 2024-02-11 DIAGNOSIS — H31093 Other chorioretinal scars, bilateral: Secondary | ICD-10-CM | POA: Diagnosis not present

## 2024-02-11 DIAGNOSIS — H2511 Age-related nuclear cataract, right eye: Secondary | ICD-10-CM | POA: Diagnosis not present

## 2024-02-27 DIAGNOSIS — H2511 Age-related nuclear cataract, right eye: Secondary | ICD-10-CM | POA: Diagnosis not present

## 2024-02-27 DIAGNOSIS — H5201 Hypermetropia, right eye: Secondary | ICD-10-CM | POA: Diagnosis not present

## 2024-03-30 DIAGNOSIS — E113511 Type 2 diabetes mellitus with proliferative diabetic retinopathy with macular edema, right eye: Secondary | ICD-10-CM | POA: Diagnosis not present

## 2024-03-31 DIAGNOSIS — S0501XD Injury of conjunctiva and corneal abrasion without foreign body, right eye, subsequent encounter: Secondary | ICD-10-CM | POA: Diagnosis not present

## 2024-04-02 DIAGNOSIS — S0501XD Injury of conjunctiva and corneal abrasion without foreign body, right eye, subsequent encounter: Secondary | ICD-10-CM | POA: Diagnosis not present

## 2024-05-12 DIAGNOSIS — H43821 Vitreomacular adhesion, right eye: Secondary | ICD-10-CM | POA: Diagnosis not present

## 2024-05-12 DIAGNOSIS — H4311 Vitreous hemorrhage, right eye: Secondary | ICD-10-CM | POA: Diagnosis not present

## 2024-05-12 DIAGNOSIS — E113593 Type 2 diabetes mellitus with proliferative diabetic retinopathy without macular edema, bilateral: Secondary | ICD-10-CM | POA: Diagnosis not present

## 2024-05-12 DIAGNOSIS — H31093 Other chorioretinal scars, bilateral: Secondary | ICD-10-CM | POA: Diagnosis not present

## 2024-05-12 DIAGNOSIS — H2511 Age-related nuclear cataract, right eye: Secondary | ICD-10-CM | POA: Diagnosis not present

## 2024-07-13 DIAGNOSIS — E113511 Type 2 diabetes mellitus with proliferative diabetic retinopathy with macular edema, right eye: Secondary | ICD-10-CM | POA: Diagnosis not present

## 2024-07-28 DIAGNOSIS — E11319 Type 2 diabetes mellitus with unspecified diabetic retinopathy without macular edema: Secondary | ICD-10-CM | POA: Diagnosis not present

## 2024-07-28 DIAGNOSIS — R197 Diarrhea, unspecified: Secondary | ICD-10-CM | POA: Diagnosis not present

## 2024-07-28 DIAGNOSIS — Z1331 Encounter for screening for depression: Secondary | ICD-10-CM | POA: Diagnosis not present

## 2024-07-28 DIAGNOSIS — Z23 Encounter for immunization: Secondary | ICD-10-CM | POA: Diagnosis not present

## 2024-08-20 DIAGNOSIS — Z125 Encounter for screening for malignant neoplasm of prostate: Secondary | ICD-10-CM | POA: Diagnosis not present

## 2024-08-20 DIAGNOSIS — L03115 Cellulitis of right lower limb: Secondary | ICD-10-CM | POA: Diagnosis not present

## 2024-08-20 DIAGNOSIS — E7800 Pure hypercholesterolemia, unspecified: Secondary | ICD-10-CM | POA: Diagnosis not present

## 2024-08-20 DIAGNOSIS — E11319 Type 2 diabetes mellitus with unspecified diabetic retinopathy without macular edema: Secondary | ICD-10-CM | POA: Diagnosis not present

## 2024-09-07 DIAGNOSIS — E113593 Type 2 diabetes mellitus with proliferative diabetic retinopathy without macular edema, bilateral: Secondary | ICD-10-CM | POA: Diagnosis not present

## 2024-09-07 DIAGNOSIS — H4311 Vitreous hemorrhage, right eye: Secondary | ICD-10-CM | POA: Diagnosis not present

## 2024-09-07 DIAGNOSIS — H2511 Age-related nuclear cataract, right eye: Secondary | ICD-10-CM | POA: Diagnosis not present

## 2024-09-07 DIAGNOSIS — H31093 Other chorioretinal scars, bilateral: Secondary | ICD-10-CM | POA: Diagnosis not present

## 2024-09-07 DIAGNOSIS — H43821 Vitreomacular adhesion, right eye: Secondary | ICD-10-CM | POA: Diagnosis not present
